# Patient Record
Sex: Female | Born: 1985 | Race: White | Hispanic: No | Marital: Married | Smoking: Never smoker
Health system: Southern US, Community
[De-identification: ages and names within clinical notes are randomized; demographics above are authoritative.]

## PROBLEM LIST (undated history)

## (undated) ENCOUNTER — Inpatient Hospital Stay (HOSPITAL_COMMUNITY): Payer: Self-pay

## (undated) DIAGNOSIS — J309 Allergic rhinitis, unspecified: Secondary | ICD-10-CM

## (undated) DIAGNOSIS — F419 Anxiety disorder, unspecified: Secondary | ICD-10-CM

## (undated) DIAGNOSIS — D369 Benign neoplasm, unspecified site: Secondary | ICD-10-CM

## (undated) DIAGNOSIS — R631 Polydipsia: Secondary | ICD-10-CM

## (undated) DIAGNOSIS — Z6832 Body mass index (BMI) 32.0-32.9, adult: Secondary | ICD-10-CM

## (undated) DIAGNOSIS — E039 Hypothyroidism, unspecified: Secondary | ICD-10-CM

## (undated) DIAGNOSIS — R197 Diarrhea, unspecified: Secondary | ICD-10-CM

## (undated) DIAGNOSIS — N979 Female infertility, unspecified: Secondary | ICD-10-CM

## (undated) DIAGNOSIS — R569 Unspecified convulsions: Secondary | ICD-10-CM

## (undated) DIAGNOSIS — G4710 Hypersomnia, unspecified: Secondary | ICD-10-CM

## (undated) DIAGNOSIS — E669 Obesity, unspecified: Secondary | ICD-10-CM

## (undated) DIAGNOSIS — Z23 Encounter for immunization: Secondary | ICD-10-CM

## (undated) DIAGNOSIS — R1030 Lower abdominal pain, unspecified: Secondary | ICD-10-CM

## (undated) HISTORY — DX: Unspecified convulsions: R56.9

## (undated) HISTORY — DX: Allergic rhinitis, unspecified: J30.9

## (undated) HISTORY — DX: Hypersomnia, unspecified: G47.10

## (undated) HISTORY — DX: Body mass index (BMI) 32.0-32.9, adult: Z68.32

## (undated) HISTORY — PX: WISDOM TOOTH EXTRACTION: SHX21

## (undated) HISTORY — DX: Anxiety disorder, unspecified: F41.9

## (undated) HISTORY — DX: Obesity, unspecified: E66.9

## (undated) HISTORY — DX: Encounter for immunization: Z23

## (undated) HISTORY — DX: Female infertility, unspecified: N97.9

## (undated) HISTORY — DX: Diarrhea, unspecified: R19.7

## (undated) HISTORY — DX: Lower abdominal pain, unspecified: R10.30

## (undated) HISTORY — DX: Polydipsia: R63.1

---
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
## 2013-10-18 ENCOUNTER — Inpatient Hospital Stay (HOSPITAL_COMMUNITY)
Admission: AD | Admit: 2013-10-18 | Discharge: 2013-10-18 | Disposition: A | Discharge status: Home / Self Care | Payer: 59 | Source: Ambulatory Visit | Attending: Obstetrics & Gynecology | Admitting: Obstetrics & Gynecology

## 2013-10-18 ENCOUNTER — Telehealth (HOSPITAL_COMMUNITY): Payer: Self-pay

## 2013-10-18 ENCOUNTER — Encounter (HOSPITAL_COMMUNITY): Payer: Self-pay

## 2013-10-18 DIAGNOSIS — O99891 Other specified diseases and conditions complicating pregnancy: Secondary | ICD-10-CM | POA: Insufficient documentation

## 2013-10-18 DIAGNOSIS — O9989 Other specified diseases and conditions complicating pregnancy, childbirth and the puerperium: Principal | ICD-10-CM

## 2013-10-18 DIAGNOSIS — R6883 Chills (without fever): Secondary | ICD-10-CM | POA: Insufficient documentation

## 2013-10-18 DIAGNOSIS — R059 Cough, unspecified: Secondary | ICD-10-CM | POA: Insufficient documentation

## 2013-10-18 DIAGNOSIS — H9209 Otalgia, unspecified ear: Secondary | ICD-10-CM | POA: Insufficient documentation

## 2013-10-18 DIAGNOSIS — R6889 Other general symptoms and signs: Secondary | ICD-10-CM

## 2013-10-18 DIAGNOSIS — J029 Acute pharyngitis, unspecified: Secondary | ICD-10-CM | POA: Insufficient documentation

## 2013-10-18 DIAGNOSIS — R05 Cough: Secondary | ICD-10-CM | POA: Insufficient documentation

## 2013-10-18 LAB — RAPID STREP SCREEN (MED CTR MEBANE ONLY): Streptococcus, Group A Screen (Direct): NEGATIVE

## 2013-10-18 MED ORDER — OSELTAMIVIR PHOSPHATE 75 MG PO CAPS: 75.0000 mg | ORAL_CAPSULE | Freq: Two times a day (BID) | ORAL | Status: DC

## 2013-10-18 NOTE — MAU Provider Note (Signed)
Chief Complaint: Sore Throat, Otalgia and Cough   First Provider Initiated Contact with Patient 10/18/13 1805     SUBJECTIVE HPI: Tanya Richards is a 28 y.o. G1P0 at [redacted]w[redacted]d by LMP who presents to maternity admissions reporting sudden onset of sore throat, ear pain, cough, body aches, and chills yesterday.  She has no known exposures to illness.  She denies abdominal pain, vaginal bleeding, vaginal itching/burning, urinary symptoms, h/a, dizziness, or n/v.  She took Tylenol a few hours ago for her body aches.     Past Medical History  Diagnosis Date  . Medical history non-contributory    Past Surgical History  Procedure Laterality Date  . No past surgeries     History   Social History  . Marital Status: Married    Spouse Name: N/A    Number of Children: N/A  . Years of Education: N/A   Occupational History  . Not on file.   Social History Main Topics  . Smoking status: Never Smoker   . Smokeless tobacco: Never Used  . Alcohol Use: No  . Drug Use: No  . Sexual Activity: Yes    Birth Control/ Protection: None   Other Topics Concern  . Not on file   Social History Narrative  . No narrative on file   No current facility-administered medications on file prior to encounter.   No current outpatient prescriptions on file prior to encounter.   Allergies  Allergen Reactions  . Augmentin [Amoxicillin-Pot Clavulanate] Other (See Comments)    Patient had "really bad nightmares" while using this  . Benadryl [Diphenhydramine] Other (See Comments)    Patient states she has "really bad nightmares" while taking this    ROS: Pertinent items in HPI  OBJECTIVE Blood pressure 125/64, pulse 79, temperature 99 F (37.2 C), temperature source Oral, resp. rate 16, height 5' 5.5" (1.664 m), weight 160 lb 12.8 oz (72.938 kg), last menstrual period 08/28/2013, SpO2 100.00%. GENERAL: Well-developed, well-nourished female in no acute distress.  HEENT: Normocephalic HEART: normal  rate RESP: normal effort ABDOMEN: Soft, non-tender EXTREMITIES: Nontender, no edema NEURO: Alert and oriented  LAB RESULTS Rapid strep test negative Flu swab pending  ASSESSMENT 1. Flu-like symptoms   2. Sore throat     PLAN Discharge home Rx for Tamiflu BID x5 days Return to MAU or Urgent Care if symptoms persist or worsen F/U with your OB provider as scheduled     Medication List         acetaminophen 500 MG tablet  Commonly known as:  TYLENOL  Take 500 mg by mouth every 6 (six) hours as needed for moderate pain.     metFORMIN 500 MG 24 hr tablet  Commonly known as:  GLUCOPHAGE-XR  Take 500 mg by mouth daily.     oseltamivir 75 MG capsule  Commonly known as:  TAMIFLU  Take 1 capsule (75 mg total) by mouth 2 (two) times daily.     prenatal multivitamin Tabs tablet  Take 1 tablet by mouth daily at 12 noon.       Follow-up Information   Follow up with MORRIS, MEGAN, DO. (or Urgent Care for respiratory symptoms.  Return to MAU as needed.)    Specialty:  Obstetrics and Gynecology   Contact information:   [address], [place], [place] [address] [phone]       Fatima Blank Certified Nurse-Midwife 10/18/2013  8:27 PM

## 2013-10-18 NOTE — Discharge Instructions (Signed)
Influenza, Adult Influenza ("the flu") is a viral infection of the respiratory tract. It occurs more often in winter months because people spend more time in close contact with one another. Influenza can make you feel very sick. Influenza easily spreads from person to person (contagious). CAUSES  Influenza is caused by a virus that infects the respiratory tract. You can catch the virus by breathing in droplets from an infected person's cough or sneeze. You can also catch the virus by touching something that was recently contaminated with the virus and then touching your mouth, nose, or eyes. SYMPTOMS  Symptoms typically last 4 to 10 days and may include:  Fever.  Chills.  Headache, body aches, and muscle aches.  Sore throat.  Chest discomfort and cough.  Poor appetite.  Weakness or feeling tired.  Dizziness.  Nausea or vomiting. DIAGNOSIS  Diagnosis of influenza is often made based on your history and a physical exam. A nose or throat swab test can be done to confirm the diagnosis. RISKS AND COMPLICATIONS You may be at risk for a more severe case of influenza if you smoke cigarettes, have diabetes, have chronic heart disease (such as heart failure) or lung disease (such as asthma), or if you have a weakened immune system. Elderly people and pregnant women are also at risk for more serious infections. The most common complication of influenza is a lung infection (pneumonia). Sometimes, this complication can require emergency medical care and may be life-threatening. PREVENTION  An annual influenza vaccination (flu shot) is the best way to avoid getting influenza. An annual flu shot is now routinely recommended for all adults in the [place] TREATMENT  In mild cases, influenza goes away on its own. Treatment is directed at relieving symptoms. For more severe cases, your caregiver may prescribe antiviral medicines to shorten the sickness. Antibiotic medicines are not effective, because the  infection is caused by a virus, not by bacteria. HOME CARE INSTRUCTIONS  Only take over-the-counter or prescription medicines for pain, discomfort, or fever as directed by your caregiver.  Use a cool mist humidifier to make breathing easier.  Get plenty of rest until your temperature returns to normal. This usually takes 3 to 4 days.  Drink enough fluids to keep your urine clear or pale yellow.  Cover your mouth and nose when coughing or sneezing, and wash your hands well to avoid spreading the virus.  Stay home from work or school until your fever has been gone for at least 1 full day. SEEK MEDICAL CARE IF:   You have chest pain or a deep cough that worsens or produces more mucus.  You have nausea, vomiting, or diarrhea. SEEK IMMEDIATE MEDICAL CARE IF:   You have difficulty breathing, shortness of breath, or your skin or nails turn bluish.  You have severe neck pain or stiffness.  You have a severe headache, facial pain, or earache.  You have a worsening or recurring fever.  You have nausea or vomiting that cannot be controlled. MAKE SURE YOU:  Understand these instructions.  Will watch your condition.  Will get help right away if you are not doing well or get worse. Document Released: 09/29/2000 Document Revised: 04/02/2012 Document Reviewed: 01/01/2012 [facility] Patient Information 2014 [place], [place].  Strep Throat Strep throat is an infection of the throat caused by a bacteria named Streptococcus pyogenes. Your caregiver may call the infection streptococcal "tonsillitis" or "pharyngitis" depending on whether there are signs of inflammation in the tonsils or back of the throat. Strep  throat is most common in children aged 5 15 years during the cold months of the year, but it can occur in people of any age during any season. This infection is spread from person to person (contagious) through coughing, sneezing, or other close contact. SYMPTOMS   Fever or  chills.  Painful, swollen, red tonsils or throat.  Pain or difficulty when swallowing.  White or yellow spots on the tonsils or throat.  Swollen, tender lymph nodes or "glands" of the neck or under the jaw.  Red rash all over the body (rare). DIAGNOSIS  Many different infections can cause the same symptoms. A test must be done to confirm the diagnosis so the right treatment can be given. A "rapid strep test" can help your caregiver make the diagnosis in a few minutes. If this test is not available, a light swab of the infected area can be used for a throat culture test. If a throat culture test is done, results are usually available in a day or two. TREATMENT  Strep throat is treated with antibiotic medicine. HOME CARE INSTRUCTIONS   Gargle with 1 tsp of salt in 1 cup of warm water, 3 4 times per day or as needed for comfort.  Family members who also have a sore throat or fever should be tested for strep throat and treated with antibiotics if they have the strep infection.  Make sure everyone in your household washes their hands well.  Do not share food, drinking cups, or personal items that could cause the infection to spread to others.  You may need to eat a soft food diet until your sore throat gets better.  Drink enough water and fluids to keep your urine clear or pale yellow. This will help prevent dehydration.  Get plenty of rest.  Stay home from school, daycare, or work until you have been on antibiotics for 24 hours.  Only take over-the-counter or prescription medicines for pain, discomfort, or fever as directed by your caregiver.  If antibiotics are prescribed, take them as directed. Finish them even if you start to feel better. SEEK MEDICAL CARE IF:   The glands in your neck continue to enlarge.  You develop a rash, cough, or earache.  You cough up green, yellow-brown, or bloody sputum.  You have pain or discomfort not controlled by medicines.  Your problems  seem to be getting worse rather than better. SEEK IMMEDIATE MEDICAL CARE IF:   You develop any new symptoms such as vomiting, severe headache, stiff or painful neck, chest pain, shortness of breath, or trouble swallowing.  You develop severe throat pain, drooling, or changes in your voice.  You develop swelling of the neck, or the skin on the neck becomes red and tender.  You have a fever.  You develop signs of dehydration, such as fatigue, dry mouth, and decreased urination.  You become increasingly sleepy, or you cannot wake up completely. Document Released: 09/29/2000 Document Revised: 09/18/2012 Document Reviewed: 12/01/2010 [facility] Patient Information 2014 [place], [place].

## 2013-10-18 NOTE — MAU Note (Signed)
Patient states she has had a confirmed pregnancy in the office. Was on Clomid. States she started have some achyness since 12-30, has started a sore throat and a little cough. Has not had a temperature over 99.4

## 2013-10-19 LAB — INFLUENZA PANEL BY PCR (TYPE A & B)
H1N1 flu by pcr: NOT DETECTED
Influenza A By PCR: NEGATIVE
Influenza B By PCR: NEGATIVE

## 2013-10-20 ENCOUNTER — Telehealth: Payer: Self-pay | Admitting: Advanced Practice Midwife

## 2013-10-20 LAB — CULTURE, GROUP A STREP

## 2013-10-20 MED ORDER — AZITHROMYCIN 250 MG PO TABS: ORAL_TABLET | ORAL | Status: DC

## 2013-10-20 NOTE — Telephone Encounter (Signed)
Called pt to tell her about negative flu swab.  Pt reported ongoing body aches, (now symptoms x5 days), productive cough with thick green mucous.  Called Dr Gaetano Net, recommend continue Tamiflu, add Z-pak x5 days.  Drink plenty of PO fluids.  F/U as needed in office or MAU.  Informed pt of recommendation, prescription for Z-pak added, pt to continue Tamiflu.  Pt states understanding.

## 2013-10-30 LAB — OB RESULTS CONSOLE HEPATITIS B SURFACE ANTIGEN: Hepatitis B Surface Ag: NEGATIVE

## 2013-10-30 LAB — OB RESULTS CONSOLE GC/CHLAMYDIA
Chlamydia: NEGATIVE
Gonorrhea: NEGATIVE

## 2013-10-30 LAB — OB RESULTS CONSOLE ABO/RH: RH Type: POSITIVE

## 2013-10-30 LAB — OB RESULTS CONSOLE HIV ANTIBODY (ROUTINE TESTING): HIV: NONREACTIVE

## 2013-10-30 LAB — OB RESULTS CONSOLE RUBELLA ANTIBODY, IGM: Rubella: IMMUNE

## 2013-10-30 LAB — OB RESULTS CONSOLE ANTIBODY SCREEN: Antibody Screen: NEGATIVE

## 2013-10-30 LAB — OB RESULTS CONSOLE RPR: RPR: NONREACTIVE

## 2014-05-04 LAB — OB RESULTS CONSOLE GBS: GBS: NEGATIVE

## 2014-06-02 ENCOUNTER — Telehealth (HOSPITAL_COMMUNITY): Payer: Self-pay | Admitting: *Deleted

## 2014-06-02 ENCOUNTER — Encounter (HOSPITAL_COMMUNITY): Payer: Self-pay | Admitting: *Deleted

## 2014-06-02 NOTE — Telephone Encounter (Signed)
Preadmission screen  

## 2014-06-03 ENCOUNTER — Encounter (HOSPITAL_COMMUNITY): Payer: Self-pay | Admitting: *Deleted

## 2014-06-06 ENCOUNTER — Encounter (HOSPITAL_COMMUNITY): Payer: Self-pay | Admitting: *Deleted

## 2014-06-06 ENCOUNTER — Inpatient Hospital Stay (HOSPITAL_COMMUNITY)
Admission: AD | Admit: 2014-06-06 | Discharge: 2014-06-08 | DRG: 775 | Disposition: A | Discharge status: Home / Self Care | Payer: 59 | Source: Ambulatory Visit | Attending: Obstetrics and Gynecology | Admitting: Obstetrics and Gynecology

## 2014-06-06 DIAGNOSIS — O479 False labor, unspecified: Secondary | ICD-10-CM | POA: Diagnosis present

## 2014-06-06 DIAGNOSIS — Z8249 Family history of ischemic heart disease and other diseases of the circulatory system: Secondary | ICD-10-CM | POA: Diagnosis not present

## 2014-06-06 DIAGNOSIS — Z833 Family history of diabetes mellitus: Secondary | ICD-10-CM

## 2014-06-06 LAB — CBC
HCT: 36.8 % (ref 36.0–46.0)
Hemoglobin: 12.9 g/dL (ref 12.0–15.0)
MCH: 31.6 pg (ref 26.0–34.0)
MCHC: 35.1 g/dL (ref 30.0–36.0)
MCV: 90.2 fL (ref 78.0–100.0)
Platelets: 249 10*3/uL (ref 150–400)
RBC: 4.08 MIL/uL (ref 3.87–5.11)
RDW: 12.3 % (ref 11.5–15.5)
WBC: 11.5 10*3/uL — ABNORMAL HIGH (ref 4.0–10.5)

## 2014-06-06 LAB — RPR

## 2014-06-06 MED ORDER — CITRIC ACID-SODIUM CITRATE 334-500 MG/5ML PO SOLN: 30.0000 mL | ORAL | Status: DC | PRN

## 2014-06-06 MED ORDER — ONDANSETRON HCL 4 MG/2ML IJ SOLN: 4.0000 mg | Freq: Four times a day (QID) | INTRAMUSCULAR | Status: DC | PRN

## 2014-06-06 MED ORDER — IBUPROFEN 600 MG PO TABS: 600.0000 mg | ORAL_TABLET | Freq: Four times a day (QID) | ORAL | Status: DC | PRN

## 2014-06-06 MED ORDER — OXYTOCIN 40 UNITS IN LACTATED RINGERS INFUSION - SIMPLE MED
62.5000 mL/h | INTRAVENOUS | Status: DC
  Administered 2014-06-06: 62.5 mL/h via INTRAVENOUS
  Filled 2014-06-06: qty 1000

## 2014-06-06 MED ORDER — BENZOCAINE-MENTHOL 20-0.5 % EX AERO
1.0000 "application " | INHALATION_SPRAY | CUTANEOUS | Status: DC | PRN
  Administered 2014-06-06: 1 via TOPICAL
  Filled 2014-06-06 (×2): qty 56

## 2014-06-06 MED ORDER — OXYCODONE-ACETAMINOPHEN 5-325 MG PO TABS: 1.0000 | ORAL_TABLET | ORAL | Status: DC | PRN

## 2014-06-06 MED ORDER — OXYCODONE-ACETAMINOPHEN 5-325 MG PO TABS
1.0000 | ORAL_TABLET | ORAL | Status: DC | PRN
Start: 2014-06-06 — End: 2014-06-08

## 2014-06-06 MED ORDER — SENNOSIDES-DOCUSATE SODIUM 8.6-50 MG PO TABS
2.0000 | ORAL_TABLET | ORAL | Status: DC
  Administered 2014-06-07 – 2014-06-08 (×2): 2 via ORAL
  Filled 2014-06-06 (×2): qty 2

## 2014-06-06 MED ORDER — TETANUS-DIPHTH-ACELL PERTUSSIS 5-2.5-18.5 LF-MCG/0.5 IM SUSP
0.5000 mL | Freq: Once | INTRAMUSCULAR | Status: DC
  Filled 2014-06-06: qty 0.5

## 2014-06-06 MED ORDER — SIMETHICONE 80 MG PO CHEW: 80.0000 mg | CHEWABLE_TABLET | ORAL | Status: DC | PRN

## 2014-06-06 MED ORDER — MEASLES, MUMPS & RUBELLA VAC ~~LOC~~ INJ
0.5000 mL | INJECTION | Freq: Once | SUBCUTANEOUS | Status: DC
  Filled 2014-06-06: qty 0.5

## 2014-06-06 MED ORDER — FLEET ENEMA 7-19 GM/118ML RE ENEM
1.0000 | ENEMA | RECTAL | Status: DC | PRN
Start: 2014-06-06 — End: 2014-06-06

## 2014-06-06 MED ORDER — LIDOCAINE HCL (PF) 1 % IJ SOLN
30.0000 mL | INTRAMUSCULAR | Status: DC | PRN
  Administered 2014-06-06: 30 mL via SUBCUTANEOUS
  Filled 2014-06-06: qty 30

## 2014-06-06 MED ORDER — MEDROXYPROGESTERONE ACETATE 150 MG/ML IM SUSP: 150.0000 mg | INTRAMUSCULAR | Status: DC | PRN

## 2014-06-06 MED ORDER — ONDANSETRON HCL 4 MG/2ML IJ SOLN: 4.0000 mg | INTRAMUSCULAR | Status: DC | PRN

## 2014-06-06 MED ORDER — PRENATAL MULTIVITAMIN CH
1.0000 | ORAL_TABLET | Freq: Every day | ORAL | Status: DC
  Administered 2014-06-07 – 2014-06-08 (×2): 1 via ORAL
  Filled 2014-06-06 (×2): qty 1

## 2014-06-06 MED ORDER — ACETAMINOPHEN 325 MG PO TABS: 650.0000 mg | ORAL_TABLET | ORAL | Status: DC | PRN

## 2014-06-06 MED ORDER — ONDANSETRON HCL 4 MG PO TABS: 4.0000 mg | ORAL_TABLET | ORAL | Status: DC | PRN

## 2014-06-06 MED ORDER — LANOLIN HYDROUS EX OINT: TOPICAL_OINTMENT | CUTANEOUS | Status: DC | PRN

## 2014-06-06 MED ORDER — OXYTOCIN BOLUS FROM INFUSION: 500.0000 mL | INTRAVENOUS | Status: DC

## 2014-06-06 MED ORDER — IBUPROFEN 600 MG PO TABS
600.0000 mg | ORAL_TABLET | Freq: Four times a day (QID) | ORAL | Status: DC
  Administered 2014-06-06 – 2014-06-08 (×7): 600 mg via ORAL
  Filled 2014-06-06 (×7): qty 1

## 2014-06-06 MED ORDER — LACTATED RINGERS IV SOLN: 500.0000 mL | INTRAVENOUS | Status: DC | PRN

## 2014-06-06 MED ORDER — DIBUCAINE 1 % RE OINT
1.0000 | TOPICAL_OINTMENT | RECTAL | Status: DC | PRN
Start: 2014-06-06 — End: 2014-06-08
  Filled 2014-06-06: qty 28

## 2014-06-06 MED ORDER — LACTATED RINGERS IV SOLN: INTRAVENOUS | Status: DC

## 2014-06-06 MED ORDER — WITCH HAZEL-GLYCERIN EX PADS: 1.0000 "application " | MEDICATED_PAD | CUTANEOUS | Status: DC | PRN

## 2014-06-06 NOTE — MAU Note (Signed)
Contractions started last night around 2030. Have gotten worse since, also possible ROM.

## 2014-06-06 NOTE — Progress Notes (Signed)
SVD of vigerous female infant w/ apgars of 9,9.  Placenta delivered spontaneous w/ 3VC.   2nd degree lac repaired w/ 3-0 vicryl rapide.  Fundus firm.  EBL 350cc .

## 2014-06-06 NOTE — Plan of Care (Signed)
Problem: Phase I Progression Outcomes Goal: Pain controlled with appropriate interventions Outcome: Progressing Low intervention, all natural pain relief measures.

## 2014-06-06 NOTE — H&P (Signed)
Tanya Richards is a 28 y.o. female G1P0 @ 40+2 wks presenting for labor.  No lof or vb.  Pregnancy uncomplicated.  History OB History   Grav Para Term Preterm Abortions TAB SAB Ect Mult Living   1              Past Medical History  Diagnosis Date  . Medical history non-contributory   . Anxiety   . Seizures     as a child for 1 year  . Immunization, BCG   . Infertility, female    Past Surgical History  Procedure Laterality Date  . No past surgeries    . Wisdom tooth extraction     Family History: family history includes Cancer in her maternal grandmother and paternal grandmother; Depression in her maternal grandmother and mother; Diabetes in her paternal grandfather and paternal grandmother; Hyperlipidemia in her father; Hypertension in her father. Social History:  reports that she has never smoked. She has never used smokeless tobacco. She reports that she does not drink alcohol or use illicit drugs.   Prenatal Transfer Tool  Maternal Diabetes: No Genetic Screening: Normal Maternal Ultrasounds/Referrals: Normal Fetal Ultrasounds or other Referrals:  None Maternal Substance Abuse:  No Significant Maternal Medications:  None Significant Maternal Lab Results:  None Other Comments:  None  ROS  Dilation: 7 Effacement (%): 100 Station: -2 Exam by:: dr Albesa Seen change Blood pressure 123/62, pulse 54, temperature 98.1 F (36.7 C), temperature source Axillary, resp. rate 20, height 5' 5.5" (1.664 m), weight 89.721 kg (197 lb 12.8 oz), last menstrual period 08/28/2013. Exam Physical Exam Gen - uncomfortable w/ ctx Abd - gravid, NT Ext - NT, trace edema bilaterally Cvx - 4cm on admit 7cm now  - AROM, clear  Prenatal labs: ABO, Rh: A/Positive/-- (01/15 0000) Antibody: Negative (01/15 0000) Rubella: Immune (01/15 0000) RPR: Nonreactive (01/15 0000)  HBsAg: Negative (01/15 0000)  HIV: Non-reactive (01/15 0000)  GBS: Negative (07/20 0000)    Assessment/Plan: Labor Ext mngt   Nikko Goldwire 06/06/2014, 5:26 PM

## 2014-06-07 LAB — CBC
HCT: 35.8 % — ABNORMAL LOW (ref 36.0–46.0)
Hemoglobin: 12.1 g/dL (ref 12.0–15.0)
MCH: 31.2 pg (ref 26.0–34.0)
MCHC: 33.8 g/dL (ref 30.0–36.0)
MCV: 92.3 fL (ref 78.0–100.0)
Platelets: 247 10*3/uL (ref 150–400)
RBC: 3.88 MIL/uL (ref 3.87–5.11)
RDW: 12.6 % (ref 11.5–15.5)
WBC: 13.8 10*3/uL — ABNORMAL HIGH (ref 4.0–10.5)

## 2014-06-07 MED ORDER — IBUPROFEN 600 MG PO TABS: 600.0000 mg | ORAL_TABLET | Freq: Four times a day (QID) | ORAL | Status: DC

## 2014-06-07 NOTE — Progress Notes (Addendum)
Post Partum Day 1 Subjective: no complaints  Objective: Blood pressure 113/59, pulse 65, temperature 98.3 F (36.8 C), temperature source Oral, resp. rate 20, height 5' 5.5" (1.664 m), weight 89.721 kg (197 lb 12.8 oz), last menstrual period 08/28/2013, SpO2 96.00%, unknown if currently breastfeeding.  Physical Exam:  General: alert and cooperative Lochia: appropriate Uterine Fundus: firm Incision: n/a DVT Evaluation: No evidence of DVT seen on physical exam.   Recent Labs  06/06/14 1320 06/07/14 0556  HGB 12.9 12.1  HCT 36.8 35.8*    Assessment/Plan: Pt requesting early discharge.    LOS: 1 day   Taylan Mayhan 06/07/2014, 7:04 AM

## 2014-06-07 NOTE — Lactation Note (Signed)
This note was copied from the chart of Tanya Byron Tipping. Lactation Consultation Note  Patient Name: Tanya Richards [id number] Date: 06/07/2014 Reason for consult: Initial assessment Mom reports some nipple tenderness and she needs help with latch. Baby spitty today, but awake and giving feeding ques. Assisted Mom with positioning and obtaining good depth with latch. Demonstrated how to bring bottom lip down. Mom reports less discomfort. Basic teaching reviewed. Cluster feeding discussed. Care for sore nipples reviewed. Right nipple is cracked. Advised to apply EBM, comfort gels given with instructions. Lactation brochure left for review, advised of OP services and support group. Encouraged Mom to call for assist as needed.   Maternal Data Formula Feeding for Exclusion: No Has patient been taught Hand Expression?: Yes Does the patient have breastfeeding experience prior to this delivery?: No  Feeding Feeding Type: Breast Fed Length of feed: 10 min  LATCH Score/Interventions Latch: Grasps breast easily, tongue down, lips flanged, rhythmical sucking. Intervention(s): Adjust position;Assist with latch;Breast massage;Breast compression  Audible Swallowing: A few with stimulation  Type of Nipple: Everted at rest and after stimulation  Comfort (Breast/Nipple): Filling, red/small blisters or bruises, mild/mod discomfort  Problem noted: Cracked, bleeding, blisters, bruises;Mild/Moderate discomfort Interventions  (Cracked/bleeding/bruising/blister): Expressed breast milk to nipple Interventions (Mild/moderate discomfort): Comfort gels  Hold (Positioning): Assistance needed to correctly position infant at breast and maintain latch. Intervention(s): Breastfeeding basics reviewed;Support Pillows;Position options;Skin to skin  LATCH Score: 7  Lactation Tools Discussed/Used Tools: Comfort gels   Consult Status Consult Status: Follow-up Date: 06/08/14 Follow-up type:  In-patient    Katrine Coho 06/07/2014, 7:40 PM

## 2014-06-07 NOTE — Discharge Summary (Signed)
Obstetric Discharge Summary Reason for Admission: onset of labor Prenatal Procedures: ultrasound Intrapartum Procedures: spontaneous vaginal delivery Postpartum Procedures: none Complications-Operative and Postpartum: 2nd degree perineal laceration Hemoglobin  Date Value Ref Range Status  06/07/2014 12.1  12.0 - 15.0 g/dL Final     HCT  Date Value Ref Range Status  06/07/2014 35.8* 36.0 - 46.0 % Final    Physical Exam:  General: alert and cooperative Lochia: appropriate Uterine Fundus: firm Incision: healing well, no significant drainage DVT Evaluation: No evidence of DVT seen on physical exam.  Discharge Diagnoses: Term Pregnancy-delivered  Discharge Information: Date: 06/07/2014 Activity: pelvic rest Diet: routine Medications: PNV and Ibuprofen Condition: stable Instructions: refer to practice specific booklet Discharge to: home Follow-up Information   Follow up with Margarette Asal, MD. Schedule an appointment as soon as possible for a visit in 6 weeks.   Specialty:  Obstetrics and Gynecology   Contact information:   [address] [place] [address] [phone]       Newborn Data: Live born female  Birth Weight: 7 lb 5.1 oz (3320 g) APGAR: 9, 9  Home with mother.  Tanya Richards 06/07/2014, 7:44 AM

## 2014-06-08 NOTE — Progress Notes (Signed)
Post Partum Day 2 Subjective: no complaints, up ad lib, voiding and tolerating PO  Objective: Blood pressure 110/60, pulse 62, temperature 98.5 F (36.9 C), temperature source Oral, resp. rate 19, height 5' 5.5" (1.664 m), weight 197 lb 12.8 oz (89.721 kg), last menstrual period 08/28/2013, SpO2 96.00%, unknown if currently breastfeeding.  Physical Exam:  General: alert and cooperative Lochia: appropriate Uterine Fundus: firm Incision: perineum intact DVT Evaluation: No evidence of DVT seen on physical exam. Negative Homan's sign. No cords or calf tenderness. No significant calf/ankle edema.   Recent Labs  06/06/14 1320 06/07/14 0556  HGB 12.9 12.1  HCT 36.8 35.8*    Assessment/Plan: Discharge home   LOS: 2 days   Samayah Novinger G 06/08/2014, 8:35 AM

## 2014-06-09 ENCOUNTER — Inpatient Hospital Stay (HOSPITAL_COMMUNITY): Admission: RE | Admit: 2014-06-09 | Payer: 59 | Source: Ambulatory Visit

## 2014-08-17 ENCOUNTER — Encounter (HOSPITAL_COMMUNITY): Payer: Self-pay | Admitting: *Deleted

## 2015-04-21 ENCOUNTER — Emergency Department (HOSPITAL_COMMUNITY)
Admission: EM | Admit: 2015-04-21 | Discharge: 2015-04-21 | Disposition: A | Discharge status: Home / Self Care | Payer: 59 | Attending: Emergency Medicine | Admitting: Emergency Medicine

## 2015-04-21 ENCOUNTER — Encounter (HOSPITAL_COMMUNITY): Payer: Self-pay | Admitting: *Deleted

## 2015-04-21 ENCOUNTER — Emergency Department (HOSPITAL_COMMUNITY): Payer: 59

## 2015-04-21 DIAGNOSIS — R1033 Periumbilical pain: Secondary | ICD-10-CM | POA: Insufficient documentation

## 2015-04-21 DIAGNOSIS — R1031 Right lower quadrant pain: Secondary | ICD-10-CM | POA: Insufficient documentation

## 2015-04-21 DIAGNOSIS — Z8742 Personal history of other diseases of the female genital tract: Secondary | ICD-10-CM | POA: Diagnosis not present

## 2015-04-21 DIAGNOSIS — Z3202 Encounter for pregnancy test, result negative: Secondary | ICD-10-CM | POA: Diagnosis not present

## 2015-04-21 DIAGNOSIS — R109 Unspecified abdominal pain: Secondary | ICD-10-CM

## 2015-04-21 DIAGNOSIS — Z8659 Personal history of other mental and behavioral disorders: Secondary | ICD-10-CM | POA: Insufficient documentation

## 2015-04-21 LAB — CBC WITH DIFFERENTIAL/PLATELET
Basophils Absolute: 0 10*3/uL (ref 0.0–0.1)
Basophils Relative: 0 % (ref 0–1)
Eosinophils Absolute: 0.2 10*3/uL (ref 0.0–0.7)
Eosinophils Relative: 2 % (ref 0–5)
HCT: 41.7 % (ref 36.0–46.0)
Hemoglobin: 13.5 g/dL (ref 12.0–15.0)
Lymphocytes Relative: 25 % (ref 12–46)
Lymphs Abs: 2.1 10*3/uL (ref 0.7–4.0)
MCH: 30.1 pg (ref 26.0–34.0)
MCHC: 32.4 g/dL (ref 30.0–36.0)
MCV: 92.9 fL (ref 78.0–100.0)
Monocytes Absolute: 0.5 10*3/uL (ref 0.1–1.0)
Monocytes Relative: 7 % (ref 3–12)
Neutro Abs: 5.5 10*3/uL (ref 1.7–7.7)
Neutrophils Relative %: 66 % (ref 43–77)
Platelets: 268 10*3/uL (ref 150–400)
RBC: 4.49 MIL/uL (ref 3.87–5.11)
RDW: 12.7 % (ref 11.5–15.5)
WBC: 8.3 10*3/uL (ref 4.0–10.5)

## 2015-04-21 LAB — URINALYSIS, ROUTINE W REFLEX MICROSCOPIC
Bilirubin Urine: NEGATIVE
Glucose, UA: NEGATIVE mg/dL
Ketones, ur: NEGATIVE mg/dL
Leukocytes, UA: NEGATIVE
Nitrite: NEGATIVE
Protein, ur: NEGATIVE mg/dL
Specific Gravity, Urine: 1.01 (ref 1.005–1.030)
Urobilinogen, UA: 0.2 mg/dL (ref 0.0–1.0)
pH: 7 (ref 5.0–8.0)

## 2015-04-21 LAB — COMPREHENSIVE METABOLIC PANEL
ALT: 16 U/L (ref 14–54)
AST: 19 U/L (ref 15–41)
Albumin: 4.1 g/dL (ref 3.5–5.0)
Alkaline Phosphatase: 68 U/L (ref 38–126)
Anion gap: 8 (ref 5–15)
BUN: 22 mg/dL — ABNORMAL HIGH (ref 6–20)
CO2: 26 mmol/L (ref 22–32)
Calcium: 9.4 mg/dL (ref 8.9–10.3)
Chloride: 104 mmol/L (ref 101–111)
Creatinine, Ser: 0.63 mg/dL (ref 0.44–1.00)
GFR calc Af Amer: >60 mL/min (ref >60)
GFR calc non Af Amer: >60 mL/min (ref >60)
Glucose, Bld: 103 mg/dL — ABNORMAL HIGH (ref 65–99)
Potassium: 4 mmol/L (ref 3.5–5.1)
Sodium: 138 mmol/L (ref 135–145)
Total Bilirubin: 1.2 mg/dL (ref 0.3–1.2)
Total Protein: 7.4 g/dL (ref 6.5–8.1)

## 2015-04-21 LAB — WET PREP, GENITAL
Clue Cells Wet Prep HPF POC: NONE SEEN
Trich, Wet Prep: NONE SEEN
Yeast Wet Prep HPF POC: NONE SEEN

## 2015-04-21 LAB — LIPASE, BLOOD: Lipase: 20 U/L — ABNORMAL LOW (ref 22–51)

## 2015-04-21 LAB — POC URINE PREG, ED: Preg Test, Ur: NEGATIVE

## 2015-04-21 LAB — URINE MICROSCOPIC-ADD ON

## 2015-04-21 MED ORDER — IOHEXOL 300 MG/ML  SOLN
100.0000 mL | Freq: Once | INTRAMUSCULAR | Status: AC | PRN
  Administered 2015-04-21: 100 mL via INTRAVENOUS

## 2015-04-21 MED ORDER — ONDANSETRON 8 MG PO TBDP
8.0000 mg | ORAL_TABLET | Freq: Once | ORAL | Status: AC
  Administered 2015-04-21: 8 mg via ORAL
  Filled 2015-04-21: qty 1

## 2015-04-21 MED ORDER — IOHEXOL 300 MG/ML  SOLN
50.0000 mL | Freq: Once | INTRAMUSCULAR | Status: AC | PRN
  Administered 2015-04-21: 50 mL via ORAL

## 2015-04-21 NOTE — ED Provider Notes (Signed)
CSN: [id number]     Arrival date & time 04/21/15  1513 History   First MD Initiated Contact with Patient 04/21/15 1613     Chief Complaint  Patient presents with  . Abdominal Pain     (Consider location/radiation/quality/duration/timing/severity/associated sxs/prior Treatment) HPI    PCP: No primary care provider on file. Blood pressure 128/80, pulse 78, temperature 98 F (36.7 C), temperature source Oral, resp. rate 18, SpO2 99 %, unknown if currently breastfeeding.  Tanya Richards is a 29 y.o.female with a significant PMH of anxiety presents to the ER with complaints of abdominal pains. She had a baby 10 months ago and developed abdominal pain three days ago that is periumbilical and RLQ. She called her OB who felt that she needed to go to the ER for evaluation. She has had recent nasal drainage, nausea, vomited 3 x on Friday. She has also had a sore throat recently. She has not had a menstruation since becoming pregnant and questions if this pain is due to starting her menstruation. She is having associated bloating, increased bowel movements, food makes the pain worse..  .  The patient denies diaphoresis, fever, headache, weakness (general or focal), confusion, change of vision,  neck pain, dysphagia, aphagia, chest pain, shortness of breath,  back pain, lower extremity swelling, rash.   Past Medical History  Diagnosis Date  . Medical history non-contributory   . Anxiety   . Seizures     as a child for 1 year  . Immunization, BCG   . Infertility, female    Past Surgical History  Procedure Laterality Date  . No past surgeries    . Wisdom tooth extraction     Family History  Problem Relation Age of Onset  . Hypertension Father   . Hyperlipidemia Father   . Diabetes Paternal Grandmother   . Cancer Paternal Grandmother     pancreatic  . Diabetes Paternal Grandfather   . Depression Mother   . Depression Maternal Grandmother   . Cancer Maternal Grandmother     colon    History  Substance Use Topics  . Smoking status: Never Smoker   . Smokeless tobacco: Never Used  . Alcohol Use: No   OB History    Gravida Para Term Preterm AB TAB SAB Ectopic Multiple Living   1 1 1       1      Review of Systems  10 Systems reviewed and are negative for acute change except as noted in the HPI.    Allergies  Augmentin and Benadryl  Home Medications   Prior to Admission medications   Medication Sig Start Date End Date Taking? Authorizing Provider  cetirizine (ZYRTEC) 10 MG tablet Take 10 mg by mouth daily as needed for allergies.   Yes Historical Provider, MD  fluticasone (FLONASE) 50 MCG/ACT nasal spray Place 1 spray into both nostrils daily as needed for allergies or rhinitis.   Yes Historical Provider, MD  Prenatal Vit-Fe Fumarate-FA (PRENATAL MULTIVITAMIN) TABS tablet Take 1 tablet by mouth daily.    Yes Historical Provider, MD   BP 109/43 mmHg  Pulse 49  Temp(Src) 98 F (36.7 C) (Oral)  Resp 16  SpO2 98% Physical Exam  Constitutional: She is oriented to person, place, and time. She appears well-developed and well-nourished. No distress.  HENT:  Head: Normocephalic and atraumatic.  Eyes: Pupils are equal, round, and reactive to light.  Neck: Normal range of motion. Neck supple.  Cardiovascular: Normal rate and regular rhythm.  Pulmonary/Chest: Effort normal.  Abdominal: Soft. Bowel sounds are normal. She exhibits no distension. There is tenderness in the right lower quadrant and periumbilical area. There is guarding. There is no rigidity and no rebound.  Genitourinary: Uterus normal. There is no rash or tenderness on the right labia. There is no rash or tenderness on the left labia. Cervix exhibits no motion tenderness and no friability. Right adnexum displays no mass and no tenderness. Left adnexum displays no mass and no tenderness. No bleeding in the vagina. No foreign body around the vagina. No signs of injury around the vagina. No vaginal  discharge found.  Neurological: She is alert and oriented to person, place, and time.  Skin: Skin is warm and dry.  Nursing note and vitals reviewed.   ED Course  Procedures (including critical care time) Labs Review Labs Reviewed  WET PREP, GENITAL - Abnormal; Notable for the following:    WBC, Wet Prep HPF POC FEW (*)    All other components within normal limits  URINALYSIS, ROUTINE W REFLEX MICROSCOPIC (NOT AT [facility]) - Abnormal; Notable for the following:    Hgb urine dipstick TRACE (*)    All other components within normal limits  LIPASE, BLOOD - Abnormal; Notable for the following:    Lipase 20 (*)    All other components within normal limits  COMPREHENSIVE METABOLIC PANEL - Abnormal; Notable for the following:    Glucose, Bld 103 (*)    BUN 22 (*)    All other components within normal limits  CBC WITH DIFFERENTIAL/PLATELET  URINE MICROSCOPIC-ADD ON  POC URINE PREG, ED  GC/CHLAMYDIA PROBE AMP ([place]) NOT AT [facility]    Imaging Review US Transvaginal Non-ob  04/21/2015   CLINICAL DATA:  Acute onset of right lower quadrant abdominal pain. Initial encounter.  EXAM: TRANSABDOMINAL AND TRANSVAGINAL ULTRASOUND OF PELVIS  DOPPLER ULTRASOUND OF OVARIES  TECHNIQUE: Both transabdominal and transvaginal ultrasound examinations of the pelvis were performed. Transabdominal technique was performed for global imaging of the pelvis including uterus, ovaries, adnexal regions, and pelvic cul-de-sac.  It was necessary to proceed with endovaginal exam following the transabdominal exam to visualize the ovaries in greater detail. Color and duplex Doppler ultrasound was utilized to evaluate blood flow to the ovaries.  COMPARISON:  CT of the abdomen and pelvis performed earlier today at 6:02 p.m.  FINDINGS: Uterus  Measurements: 6.5 x 2.9 x 3.8 cm. No fibroids or other mass visualized.  Endometrium  Thickness: 0.3 cm.  No focal abnormality visualized.  Right ovary  Measurements: 2.9 x 2.2 x 1.8 cm.  Normal appearance/no adnexal mass.  Left ovary  Measurements: 2.7 x 2.5 x 2.1 cm. Normal appearance/no adnexal mass.  Pulsed Doppler evaluation of both ovaries demonstrates normal low-resistance arterial and venous waveforms.  Other findings  No free fluid is seen within the pelvic cul-de-sac.  IMPRESSION: Unremarkable pelvic ultrasound.  No evidence for ovarian torsion.   Electronically Signed   By: Garald Balding M.D.   On: 04/21/2015 20:05   US Pelvis Complete  04/21/2015   CLINICAL DATA:  Acute onset of right lower quadrant abdominal pain. Initial encounter.  EXAM: TRANSABDOMINAL AND TRANSVAGINAL ULTRASOUND OF PELVIS  DOPPLER ULTRASOUND OF OVARIES  TECHNIQUE: Both transabdominal and transvaginal ultrasound examinations of the pelvis were performed. Transabdominal technique was performed for global imaging of the pelvis including uterus, ovaries, adnexal regions, and pelvic cul-de-sac.  It was necessary to proceed with endovaginal exam following the transabdominal exam to visualize the ovaries in greater  detail. Color and duplex Doppler ultrasound was utilized to evaluate blood flow to the ovaries.  COMPARISON:  CT of the abdomen and pelvis performed earlier today at 6:02 p.m.  FINDINGS: Uterus  Measurements: 6.5 x 2.9 x 3.8 cm. No fibroids or other mass visualized.  Endometrium  Thickness: 0.3 cm.  No focal abnormality visualized.  Right ovary  Measurements: 2.9 x 2.2 x 1.8 cm. Normal appearance/no adnexal mass.  Left ovary  Measurements: 2.7 x 2.5 x 2.1 cm. Normal appearance/no adnexal mass.  Pulsed Doppler evaluation of both ovaries demonstrates normal low-resistance arterial and venous waveforms.  Other findings  No free fluid is seen within the pelvic cul-de-sac.  IMPRESSION: Unremarkable pelvic ultrasound.  No evidence for ovarian torsion.   Electronically Signed   By: Garald Balding M.D.   On: 04/21/2015 20:05   Ct Abdomen Pelvis W Contrast  04/21/2015   CLINICAL DATA:  Right-sided abdominal pain.  Right lower quadrant pain.  EXAM: CT ABDOMEN AND PELVIS WITH CONTRAST  TECHNIQUE: Multidetector CT imaging of the abdomen and pelvis was performed using the standard protocol following bolus administration of intravenous contrast.  CONTRAST:  54mL OMNIPAQUE IOHEXOL 300 MG/ML SOLN, 141mL OMNIPAQUE IOHEXOL 300 MG/ML SOLN  COMPARISON:  None.  FINDINGS: Lower chest:  Lung bases are clear.  Hepatobiliary: Liver and gallbladder appear normal.  Pancreas: Normal  Spleen: Normal  Adrenals/Urinary Tract: Adrenal glands and kidneys appear normal.  Stomach/Bowel: The appendix is normal. Image 53. No bowel wall thickening or focal segmental dilatation is identified. Stomach appears normal.  Vascular/Lymphatic: No aortic aneurysm.  No lymphadenopathy.  Other: Uterus and ovaries are normal. No free air or fluid. Small fat containing umbilical hernia.  Musculoskeletal: Osseous structures appear unremarkable.  IMPRESSION: No acute intra-abdominal or pelvic pathology. Specifically, the appendix appears normal.   Electronically Signed   By: Conchita Paris M.D.   On: 04/21/2015 18:08   [place] Art/ven Flow Abd Pelv Doppler  04/21/2015   CLINICAL DATA:  Acute onset of right lower quadrant abdominal pain. Initial encounter.  EXAM: TRANSABDOMINAL AND TRANSVAGINAL ULTRASOUND OF PELVIS  DOPPLER ULTRASOUND OF OVARIES  TECHNIQUE: Both transabdominal and transvaginal ultrasound examinations of the pelvis were performed. Transabdominal technique was performed for global imaging of the pelvis including uterus, ovaries, adnexal regions, and pelvic cul-de-sac.  It was necessary to proceed with endovaginal exam following the transabdominal exam to visualize the ovaries in greater detail. Color and duplex Doppler ultrasound was utilized to evaluate blood flow to the ovaries.  COMPARISON:  CT of the abdomen and pelvis performed earlier today at 6:02 p.m.  FINDINGS: Uterus  Measurements: 6.5 x 2.9 x 3.8 cm. No fibroids or other mass visualized.   Endometrium  Thickness: 0.3 cm.  No focal abnormality visualized.  Right ovary  Measurements: 2.9 x 2.2 x 1.8 cm. Normal appearance/no adnexal mass.  Left ovary  Measurements: 2.7 x 2.5 x 2.1 cm. Normal appearance/no adnexal mass.  Pulsed Doppler evaluation of both ovaries demonstrates normal low-resistance arterial and venous waveforms.  Other findings  No free fluid is seen within the pelvic cul-de-sac.  IMPRESSION: Unremarkable pelvic ultrasound.  No evidence for ovarian torsion.   Electronically Signed   By: Garald Balding M.D.   On: 04/21/2015 20:05     EKG Interpretation None      MDM   Final diagnoses:  RLQ abdominal pain  Abdominal pain, unspecified abdominal location    Received a thorough workup today while in the emergency department. She declines pain medication  throughout her stay she is breast-feeding and did not want to waste any milk. Her urinalysis shows no infection. Her wet prep is also normal. The CMP, Lipase and CBC show no acute findings. Neg urine preg.  The CT scan of the patient's stomach does not show any abnormalities, no appendicitis, colitis or any findings to explain the patient's pain. A trans-vaginal ultrasound was done which was also normal, did not show any cysts, ovarian torsion, abnormal fluid or bleeding.  The etiology of the patient's pain is unclear however emergent etiologies have been ruled out at this time. Patient does not look toxic and continues to decline pain medication throughout her stay. She'll follow-up with her primary care doctor within the next 1-2 days for follow-up. No N/V/D, no weakness, confusion. Pt agreeable to plan for discharge-- aware that early pathology is possible and f/u is very important.  Medications  ondansetron (ZOFRAN-ODT) disintegrating tablet 8 mg (8 mg Oral Given 04/21/15 1553)  iohexol (OMNIPAQUE) 300 MG/ML solution 50 mL (50 mLs Oral Contrast Given 04/21/15 1615)  iohexol (OMNIPAQUE) 300 MG/ML solution 100 mL (100 mLs  Intravenous Contrast Given 04/21/15 1751)    29 y.o.Tanya Richards's evaluation in the Emergency Department is complete. It has been determined that no acute conditions requiring further emergency intervention are present at this time. The patient/guardian have been advised of the diagnosis and plan. We have discussed signs and symptoms that warrant return to the ED, such as changes or worsening in symptoms.  Vital signs are stable at discharge. Filed Vitals:   04/21/15 2102  BP: 109/43  Pulse: 49  Temp:   Resp: 16    Patient/guardian has voiced understanding and agreed to follow-up with the PCP or specialist.     Delos Haring, PA-C 04/22/15 0013  Pamella Pert, MD 04/22/15 1515

## 2015-04-21 NOTE — Discharge Instructions (Signed)
Abdominal Pain, Women °Abdominal (stomach, pelvic, or belly) pain can be caused by many things. It is important to tell your doctor: °· The location of the pain. °· Does it come and go or is it present all the time? °· Are there things that start the pain (eating certain foods, exercise)? °· Are there other symptoms associated with the pain (fever, nausea, vomiting, diarrhea)? °All of this is helpful to know when trying to find the cause of the pain. °CAUSES  °· Stomach: virus or bacteria infection, or ulcer. °· Intestine: appendicitis (inflamed appendix), regional ileitis (Crohn's disease), ulcerative colitis (inflamed colon), irritable bowel syndrome, diverticulitis (inflamed diverticulum of the colon), or cancer of the stomach or intestine. °· Gallbladder disease or stones in the gallbladder. °· Kidney disease, kidney stones, or infection. °· Pancreas infection or cancer. °· Fibromyalgia (pain disorder). °· Diseases of the female organs: °¨ Uterus: fibroid (non-cancerous) tumors or infection. °¨ Fallopian tubes: infection or tubal pregnancy. °¨ Ovary: cysts or tumors. °¨ Pelvic adhesions (scar tissue). °¨ Endometriosis (uterus lining tissue growing in the pelvis and on the pelvic organs). °¨ Pelvic congestion syndrome (female organs filling up with blood just before the menstrual period). °¨ Pain with the menstrual period. °¨ Pain with ovulation (producing an egg). °¨ Pain with an IUD (intrauterine device, birth control) in the uterus. °¨ Cancer of the female organs. °· Functional pain (pain not caused by a disease, may improve without treatment). °· Psychological pain. °· Depression. °DIAGNOSIS  °Your doctor will decide the seriousness of your pain by doing an examination. °· Blood tests. °· X-rays. °· Ultrasound. °· CT scan (computed tomography, special type of X-ray). °· MRI (magnetic resonance imaging). °· Cultures, for infection. °· Barium enema (dye inserted in the large intestine, to better view it with  X-rays). °· Colonoscopy (looking in intestine with a lighted tube). °· Laparoscopy (minor surgery, looking in abdomen with a lighted tube). °· Major abdominal exploratory surgery (looking in abdomen with a large incision). °TREATMENT  °The treatment will depend on the cause of the pain.  °· Many cases can be observed and treated at home. °· Over-the-counter medicines recommended by your caregiver. °· Prescription medicine. °· Antibiotics, for infection. °· Birth control pills, for painful periods or for ovulation pain. °· Hormone treatment, for endometriosis. °· Nerve blocking injections. °· Physical therapy. °· Antidepressants. °· Counseling with a psychologist or psychiatrist. °· Minor or major surgery. °HOME CARE INSTRUCTIONS  °· Do not take laxatives, unless directed by your caregiver. °· Take over-the-counter pain medicine only if ordered by your caregiver. Do not take aspirin because it can cause an upset stomach or bleeding. °· Try a clear liquid diet (broth or water) as ordered by your caregiver. Slowly move to a bland diet, as tolerated, if the pain is related to the stomach or intestine. °· Have a thermometer and take your temperature several times a day, and record it. °· Bed rest and sleep, if it helps the pain. °· Avoid sexual intercourse, if it causes pain. °· Avoid stressful situations. °· Keep your follow-up appointments and tests, as your caregiver orders. °· If the pain does not go away with medicine or surgery, you may try: °¨ Acupuncture. °¨ Relaxation exercises (yoga, meditation). °¨ Group therapy. °¨ Counseling. °SEEK MEDICAL CARE IF:  °· You notice certain foods cause stomach pain. °· Your home care treatment is not helping your pain. °· You need stronger pain medicine. °· You want your IUD removed. °· You feel faint or   lightheaded. °· You develop nausea and vomiting. °· You develop a rash. °· You are having side effects or an allergy to your medicine. °SEEK IMMEDIATE MEDICAL CARE IF:  °· Your  pain does not go away or gets worse. °· You have a fever. °· Your pain is felt only in portions of the abdomen. The right side could possibly be appendicitis. The left lower portion of the abdomen could be colitis or diverticulitis. °· You are passing blood in your stools (bright red or black tarry stools, with or without vomiting). °· You have blood in your urine. °· You develop chills, with or without a fever. °· You pass out. °MAKE SURE YOU:  °· Understand these instructions. °· Will watch your condition. °· Will get help right away if you are not doing well or get worse. °Document Released: 07/30/2007 Document Revised: 02/16/2014 Document Reviewed: 08/19/2009 °[employer]® Patient Information ©2015 [employer]. This information is not intended to replace advice given to you by your health care provider. Make sure you discuss any questions you have with your health care provider. ° °

## 2015-04-21 NOTE — ED Notes (Signed)
RN Ivar Drape starting IV

## 2015-04-21 NOTE — ED Notes (Signed)
PA notified pt has returned from ultrasound at this time

## 2015-04-21 NOTE — ED Notes (Addendum)
Pt is currently breastfeeding. Pt reports lots of nasal drainage, pt has been taking zyrtec and flonase, pt thought she was having nausea from nasal drainage. Pt started having intermittent abd pain and nausea since Friday, vomited 3x on Friday. Saturday had sore throat, went to urgent care, was negative for strep throat, negative pregnancy. Monday started having intermittent sharp abd pain, today started having abd bloating, R sided abd pain, increased bowel movements, food increases abdominal pain. At times abd pain felt like early beginnings of labor. Pt thought it was possibly her first period after having baby, called ob gyn nurse and was encouraged to come to ED. At present 5/10.

## 2015-04-22 LAB — GC/CHLAMYDIA PROBE AMP (~~LOC~~) NOT AT ARMC
Chlamydia: NEGATIVE
Neisseria Gonorrhea: NEGATIVE

## 2015-07-20 ENCOUNTER — Other Ambulatory Visit: Payer: Self-pay | Admitting: Obstetrics and Gynecology

## 2015-07-21 LAB — CYTOLOGY - PAP

## 2015-12-11 IMAGING — US US PELVIS COMPLETE
1 series · 13 of 25 positions shown · non-contrast
Comparison: CT of the abdomen and pelvis performed earlier today at
[DATE] p.m.

CLINICAL DATA: Acute onset of right lower quadrant abdominal pain.
Initial encounter.

EXAM:
TRANSABDOMINAL AND TRANSVAGINAL ULTRASOUND OF PELVIS
DOPPLER ULTRASOUND OF OVARIES
TECHNIQUE: Both transabdominal and transvaginal ultrasound examinations of the
pelvis were performed. Transabdominal technique was performed for
global imaging of the pelvis including uterus, ovaries, adnexal
regions, and pelvic cul-de-sac.
It was necessary to proceed with endovaginal exam following the
transabdominal exam to visualize the ovaries in greater detail.
Color and duplex Doppler ultrasound was utilized to evaluate blood
flow to the ovaries.

[Series 1: us pelvis complete · 0.18mm/px · 13 of 57 slices shown]
[im 1/57]
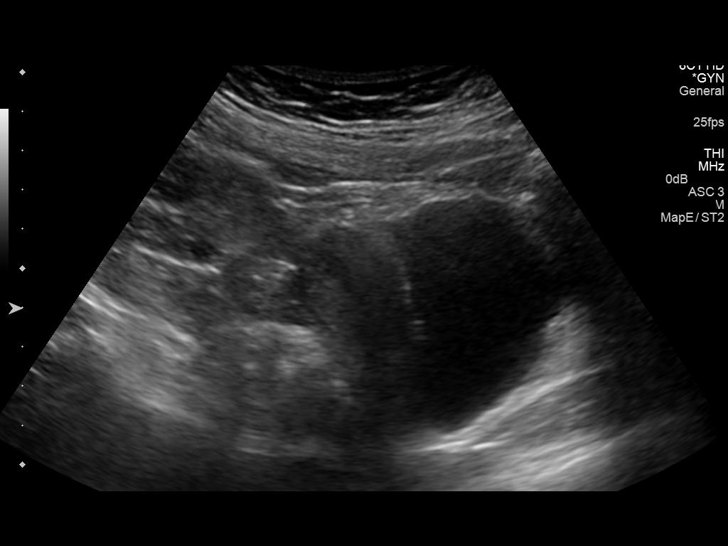
[im 5/57]
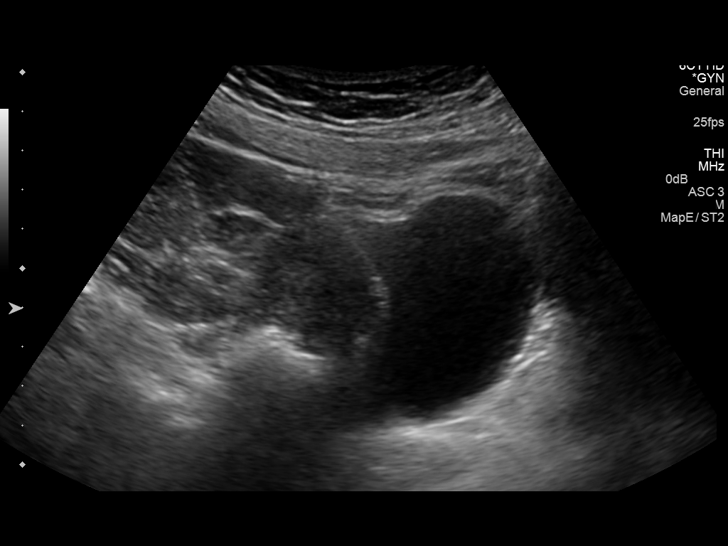
[im 10/57]
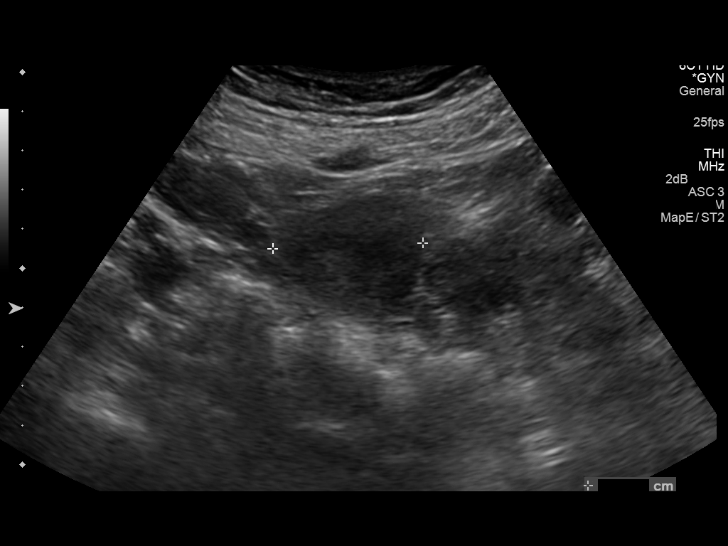
[im 15/57]
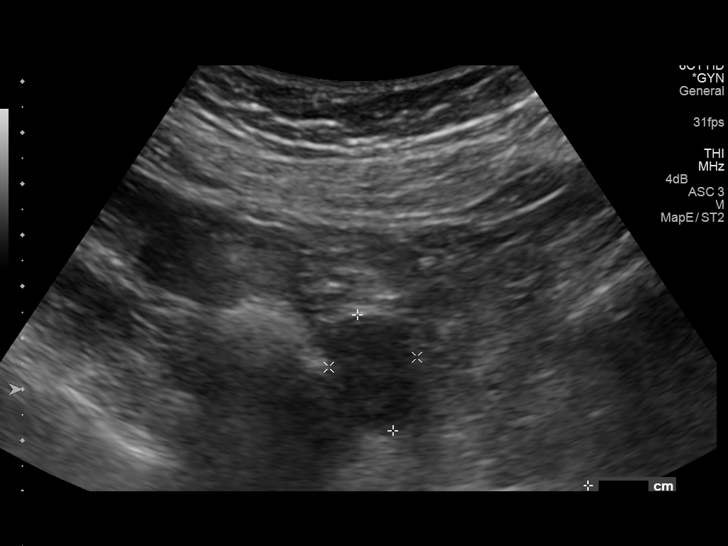
[im 19/57]
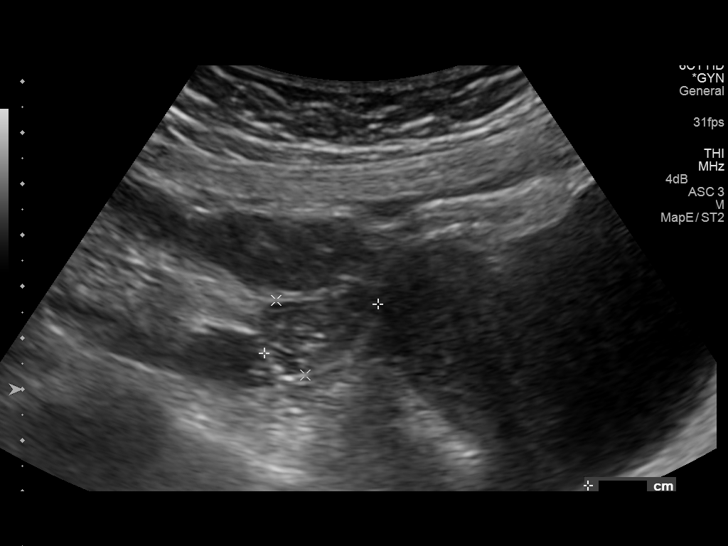
[im 24/57]
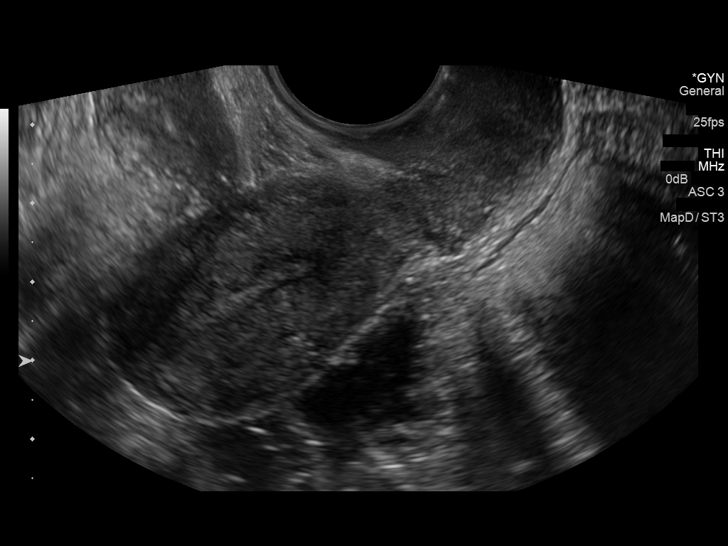
[im 29/57]
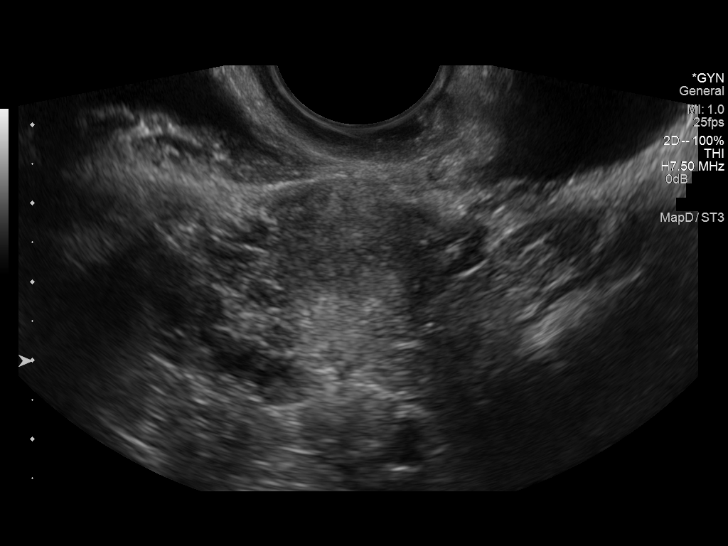
[im 33/57]
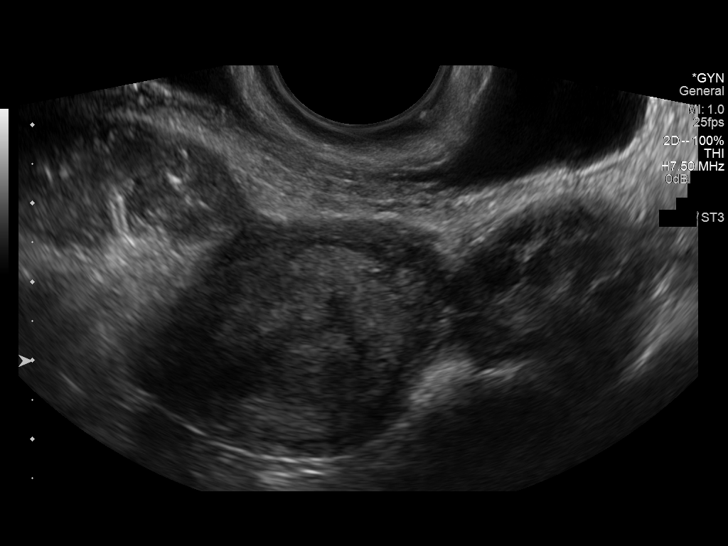
[im 38/57]
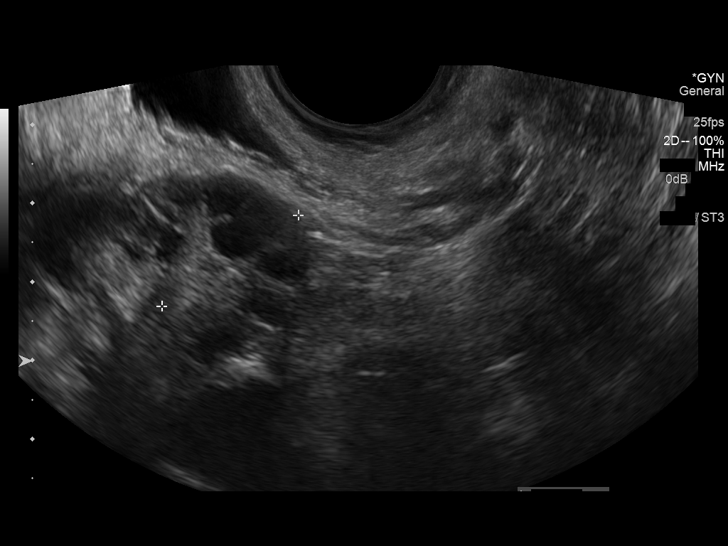
[im 43/57]
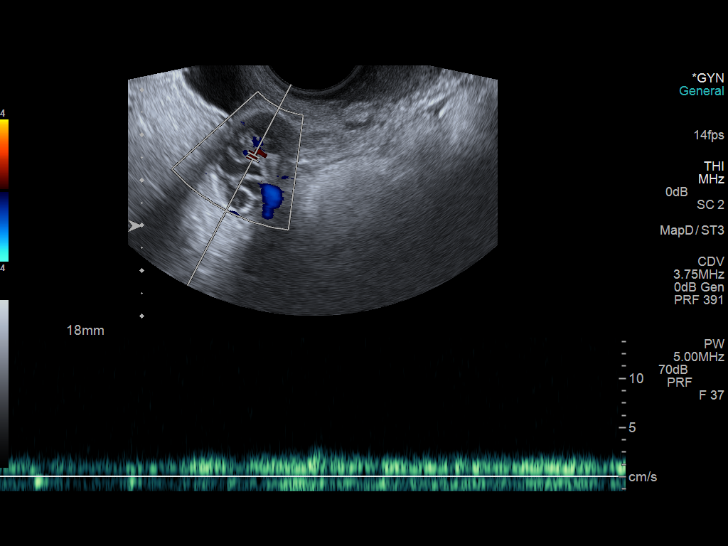
[im 47/57]
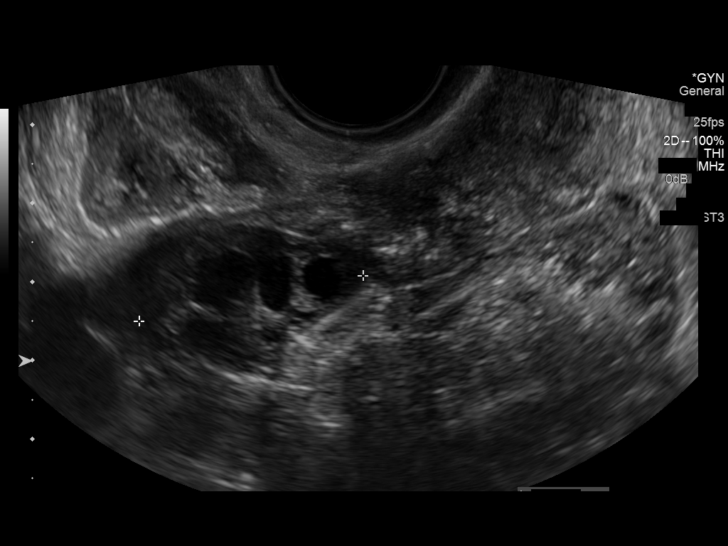
[im 52/57]
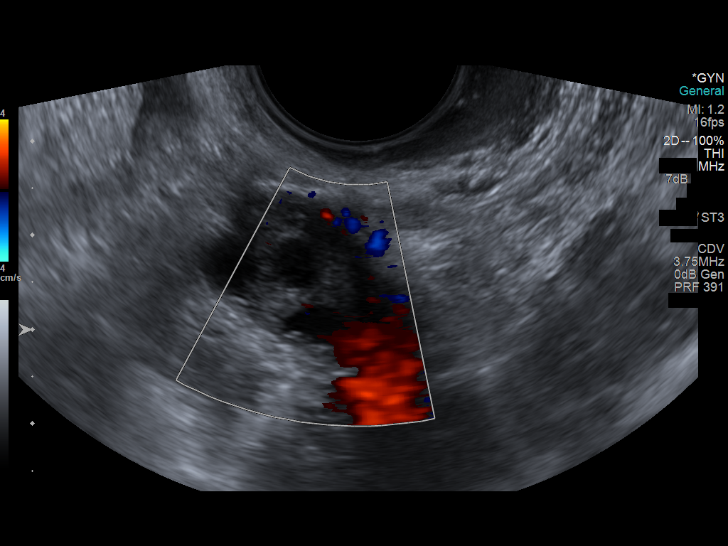
[im 57/57]
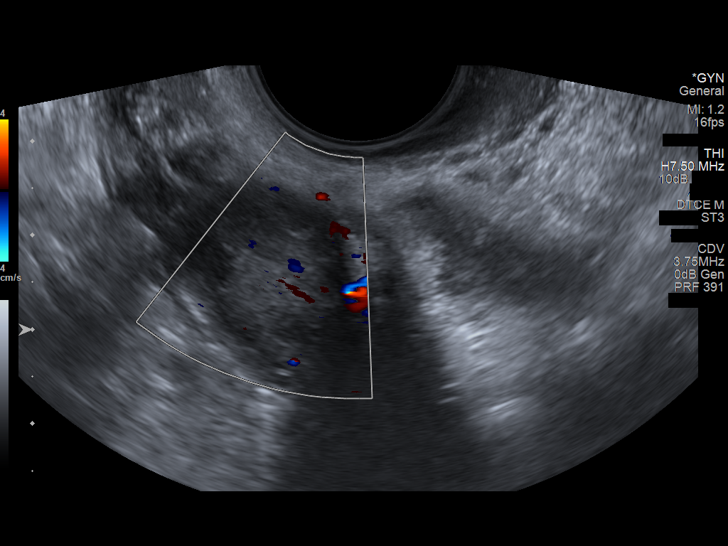

[13 of 25 positions shown; findings below may reference images not displayed]

FINDINGS: Uterus

Measurements: 6.5 x 2.9 x 3.8 cm. No fibroids or other mass
visualized.

Endometrium

Thickness: 0.3 cm.  No focal abnormality visualized.

Right ovary

Measurements: 2.9 x 2.2 x 1.8 cm. Normal appearance/no adnexal mass.

Left ovary

Measurements: 2.7 x 2.5 x 2.1 cm. Normal appearance/no adnexal mass.

Pulsed Doppler evaluation of both ovaries demonstrates normal
low-resistance arterial and venous waveforms.

Other findings

No free fluid is seen within the pelvic cul-de-sac.
IMPRESSION: Unremarkable pelvic ultrasound.  No evidence for ovarian torsion.

## 2016-12-29 DIAGNOSIS — J069 Acute upper respiratory infection, unspecified: Secondary | ICD-10-CM | POA: Diagnosis not present

## 2017-01-16 DIAGNOSIS — J208 Acute bronchitis due to other specified organisms: Secondary | ICD-10-CM | POA: Diagnosis not present

## 2017-01-16 DIAGNOSIS — R062 Wheezing: Secondary | ICD-10-CM | POA: Diagnosis not present

## 2017-01-19 DIAGNOSIS — E039 Hypothyroidism, unspecified: Secondary | ICD-10-CM | POA: Diagnosis not present

## 2017-01-19 DIAGNOSIS — J209 Acute bronchitis, unspecified: Secondary | ICD-10-CM | POA: Diagnosis not present

## 2017-03-13 DIAGNOSIS — Z0000 Encounter for general adult medical examination without abnormal findings: Secondary | ICD-10-CM | POA: Diagnosis not present

## 2017-05-25 IMAGING — CT CT ABD-PELV W/ CM
2 of 4 series · 16 of 46 positions shown, 18 images · IV contrast (OMNIPAQUE 300)
Comparison: None.

CLINICAL DATA: Right-sided abdominal pain. Right lower quadrant
pain.

EXAM:
CT ABDOMEN AND PELVIS WITH CONTRAST
TECHNIQUE: Multidetector CT imaging of the abdomen and pelvis was performed
using the standard protocol following bolus administration of
intravenous contrast.
CONTRAST:  50mL OMNIPAQUE IOHEXOL 300 MG/ML SOLN, 100mL OMNIPAQUE
IOHEXOL 300 MG/ML SOLN

[Series 2: abd/pel with · axial · 0.67mm/px · z∈[+789,+1224]mm · 13 of 95 slices shown, 15 images]
[im 4/95  soft-tissue]
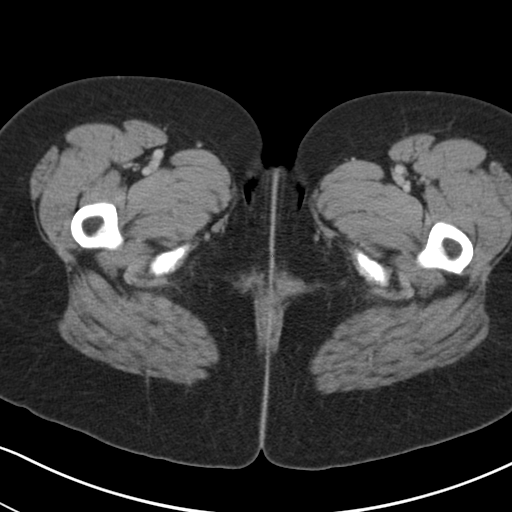
[im 4/95  bone]
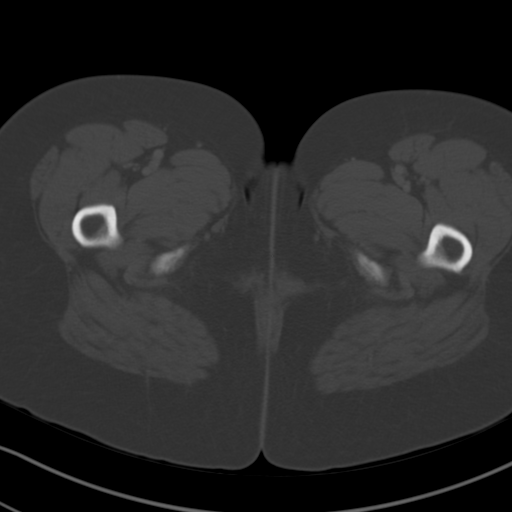
[im 12/95  soft-tissue]
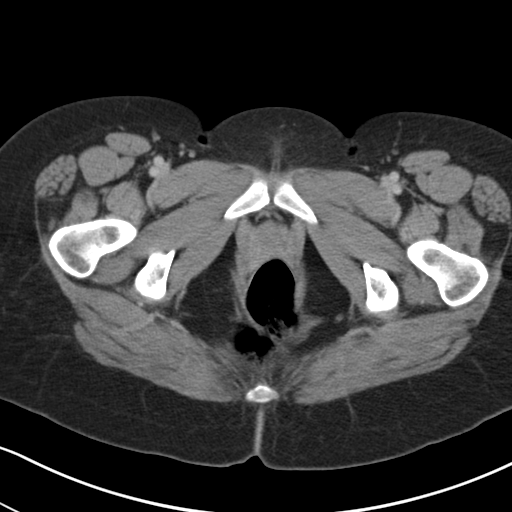
[im 19/95  soft-tissue]
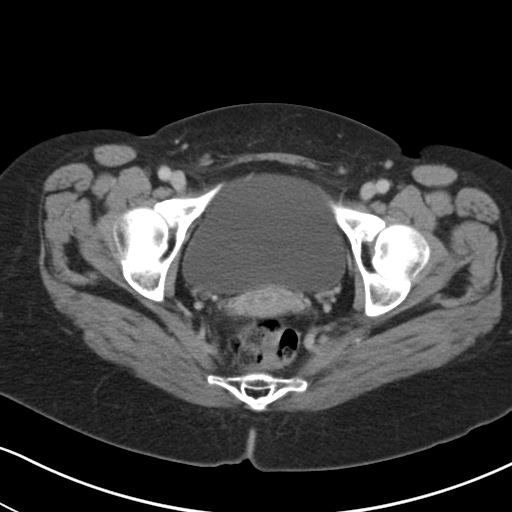
[im 27/95  soft-tissue]
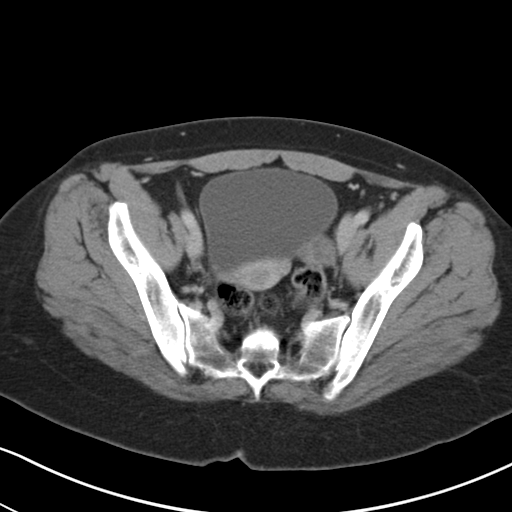
[im 34/95  soft-tissue]
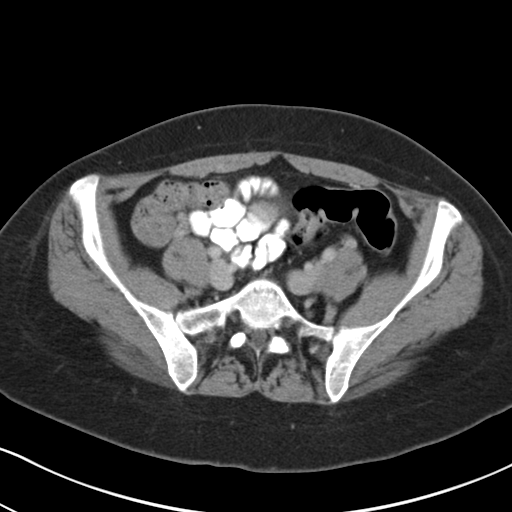
[im 42/95  soft-tissue]
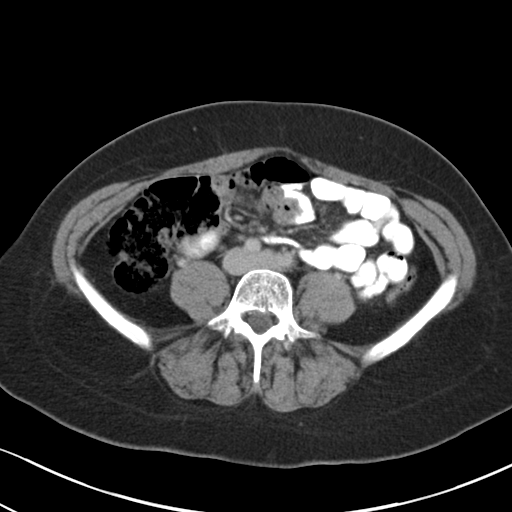
[im 49/95  soft-tissue]
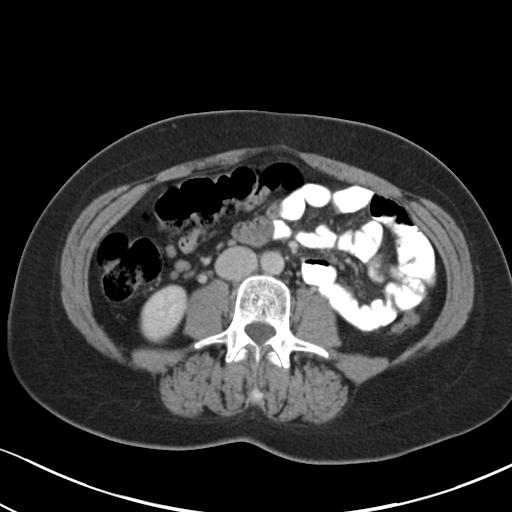
[im 53/95  soft-tissue]
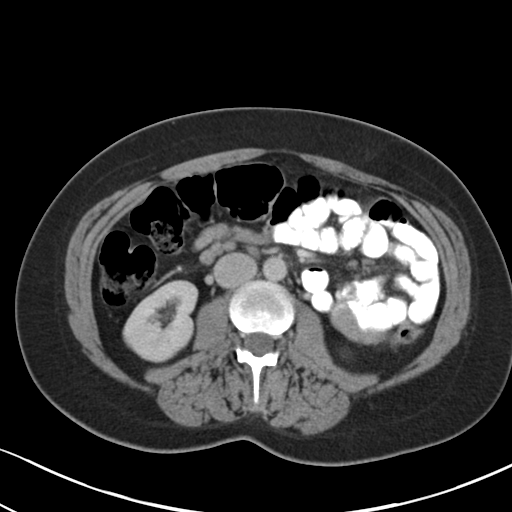
[im 61/95  soft-tissue]
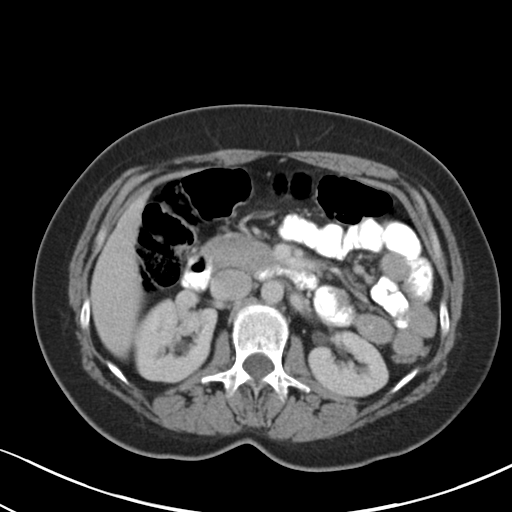
[im 61/95  bone]
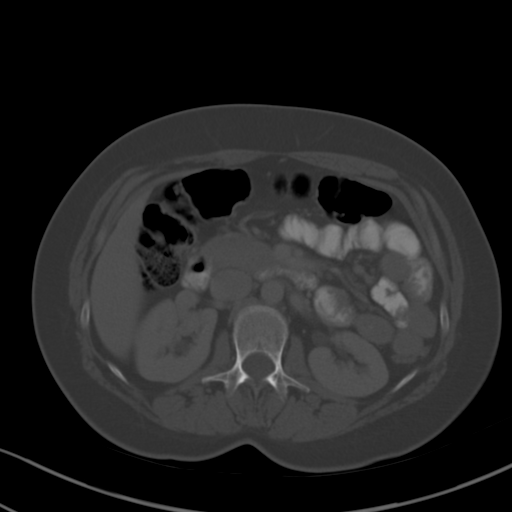
[im 68/95  soft-tissue]
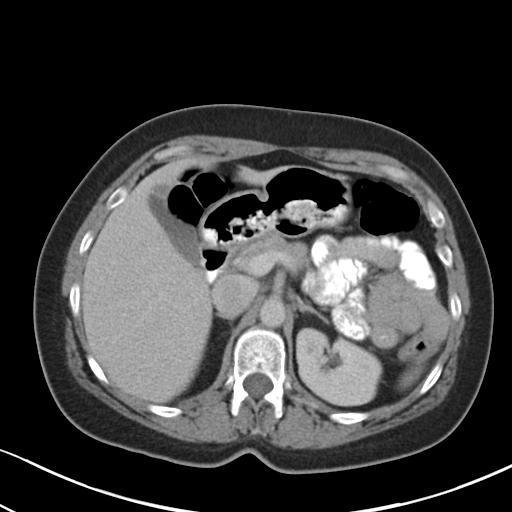
[im 76/95  soft-tissue]
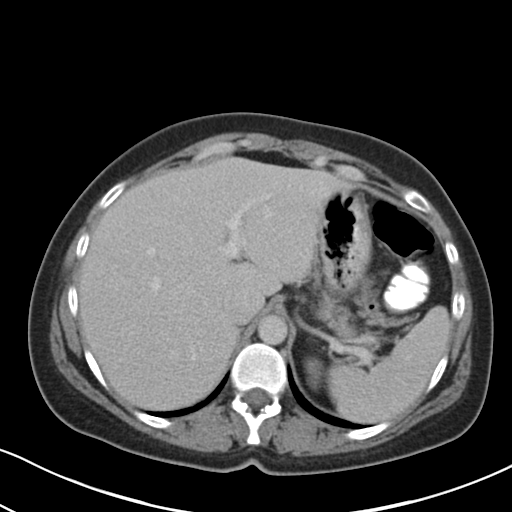
[im 83/95  soft-tissue]
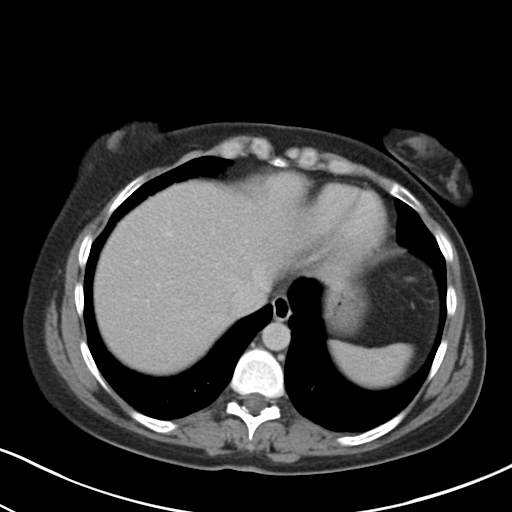
[im 91/95  soft-tissue]
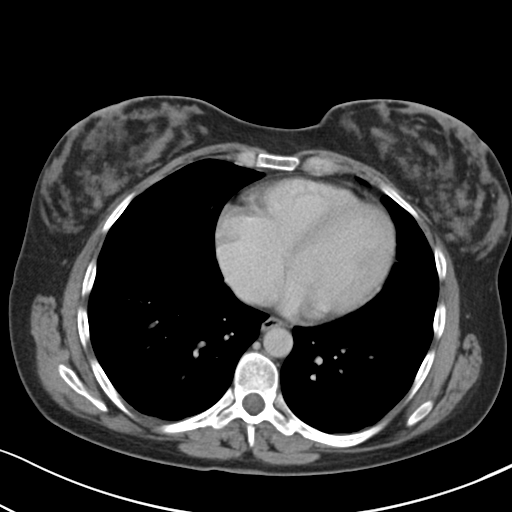

[Series 5: coronal a/|p · coronal · 0.70mm/px · 3 of 82 slices shown]
[im 28/82  soft-tissue]
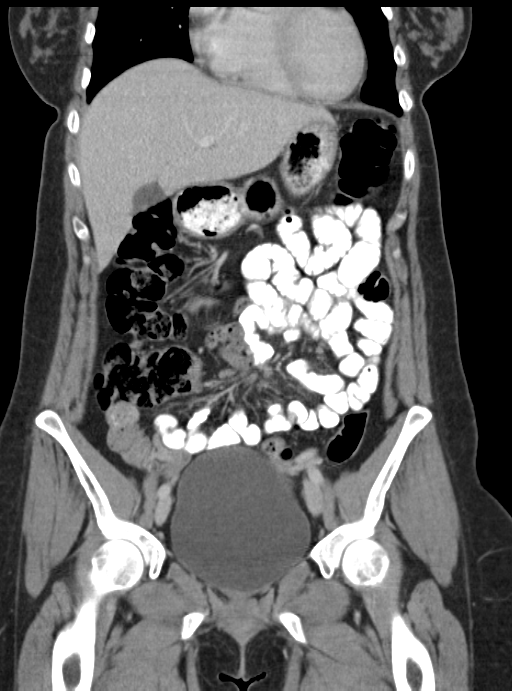
[im 37/82  soft-tissue]
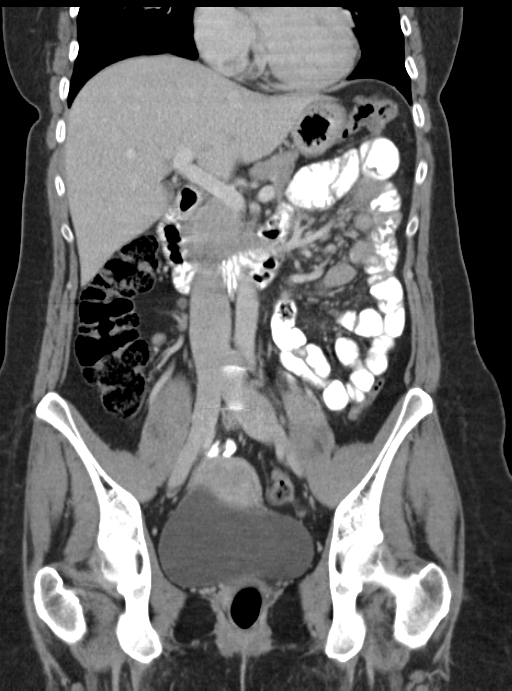
[im 46/82  soft-tissue]
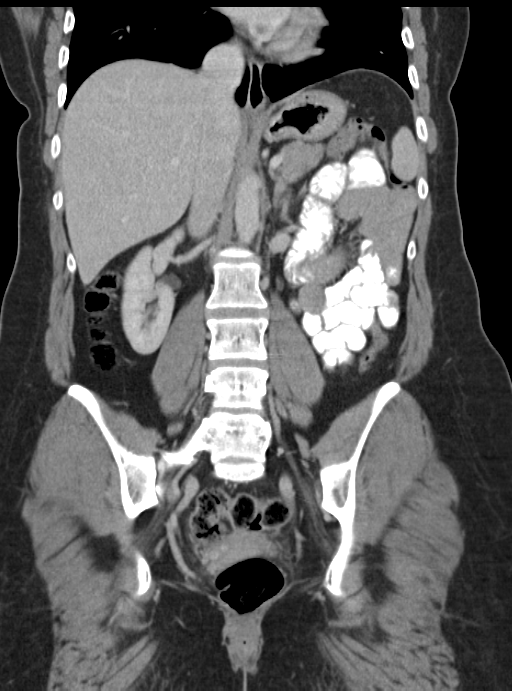

[16 of 46 positions shown; findings below may reference images not displayed]

FINDINGS: Lower chest:  Lung bases are clear.

Hepatobiliary: Liver and gallbladder appear normal.

Pancreas: Normal

Spleen: Normal

Adrenals/Urinary Tract: Adrenal glands and kidneys appear normal.

Stomach/Bowel: The appendix is normal. Image 53. No bowel wall
thickening or focal segmental dilatation is identified. Stomach
appears normal.

Vascular/Lymphatic: No aortic aneurysm.  No lymphadenopathy.

Other: Uterus and ovaries are normal. No free air or fluid. Small
fat containing umbilical hernia.

Musculoskeletal: Osseous structures appear unremarkable.
IMPRESSION: No acute intra-abdominal or pelvic pathology. Specifically, the
appendix appears normal.

## 2017-06-27 DIAGNOSIS — N911 Secondary amenorrhea: Secondary | ICD-10-CM | POA: Diagnosis not present

## 2017-07-03 LAB — OB RESULTS CONSOLE RPR: RPR: NONREACTIVE

## 2017-07-03 LAB — OB RESULTS CONSOLE HIV ANTIBODY (ROUTINE TESTING): HIV: NONREACTIVE

## 2017-07-03 LAB — OB RESULTS CONSOLE GC/CHLAMYDIA
Chlamydia: NEGATIVE
Gonorrhea: NEGATIVE

## 2017-07-03 LAB — OB RESULTS CONSOLE ABO/RH: RH Type: POSITIVE

## 2017-07-03 LAB — OB RESULTS CONSOLE RUBELLA ANTIBODY, IGM: Rubella: IMMUNE

## 2017-07-03 LAB — OB RESULTS CONSOLE ANTIBODY SCREEN: Antibody Screen: NEGATIVE

## 2017-07-03 LAB — OB RESULTS CONSOLE HEPATITIS B SURFACE ANTIGEN: Hepatitis B Surface Ag: NEGATIVE

## 2017-07-25 DIAGNOSIS — Z23 Encounter for immunization: Secondary | ICD-10-CM | POA: Diagnosis not present

## 2017-08-23 DIAGNOSIS — N390 Urinary tract infection, site not specified: Secondary | ICD-10-CM | POA: Diagnosis not present

## 2017-11-02 ENCOUNTER — Ambulatory Visit: Payer: Self-pay | Admitting: Nurse Practitioner

## 2017-11-02 ENCOUNTER — Encounter: Payer: Self-pay | Admitting: Emergency Medicine

## 2017-11-02 VITALS — BP 102/64 | HR 96 | Temp 98.4°F | Resp 20 | Wt 197.6 lb

## 2017-11-02 DIAGNOSIS — J209 Acute bronchitis, unspecified: Secondary | ICD-10-CM

## 2017-11-02 DIAGNOSIS — J019 Acute sinusitis, unspecified: Secondary | ICD-10-CM

## 2017-11-02 MED ORDER — FLUTICASONE PROPIONATE 50 MCG/ACT NA SUSP: 2.0000 | Freq: Every day | NASAL | 0 refills | Status: DC

## 2017-11-02 MED ORDER — AZITHROMYCIN 250 MG PO TABS: ORAL_TABLET | ORAL | 0 refills | Status: AC

## 2017-11-02 NOTE — Patient Instructions (Addendum)
Sinusitis, Adult Sinusitis is soreness and inflammation of your sinuses. Sinuses are hollow spaces in the bones around your face. Your sinuses are located:  Around your eyes.  In the middle of your forehead.  Behind your nose.  In your cheekbones.  Your sinuses and nasal passages are lined with a stringy fluid (mucus). Mucus normally drains out of your sinuses. When your nasal tissues become inflamed or swollen, the mucus can become trapped or blocked so air cannot flow through your sinuses. This allows bacteria, viruses, and funguses to grow, which leads to infection. Sinusitis can develop quickly and last for 7?10 days (acute) or for more than 12 weeks (chronic). Sinusitis often develops after a cold. What are the causes? This condition is caused by anything that creates swelling in the sinuses or stops mucus from draining, including:  Allergies.  Asthma.  Bacterial or viral infection.  Abnormally shaped bones between the nasal passages.  Nasal growths that contain mucus (nasal polyps).  Narrow sinus openings.  Pollutants, such as chemicals or irritants in the air.  A foreign object stuck in the nose.  A fungal infection. This is rare.  What increases the risk? The following factors may make you more likely to develop this condition:  Having allergies or asthma.  Having had a recent cold or respiratory tract infection.  Having structural deformities or blockages in your nose or sinuses.  Having a weak immune system.  Doing a lot of swimming or diving.  Overusing nasal sprays.  Smoking.  What are the signs or symptoms? The main symptoms of this condition are pain and a feeling of pressure around the affected sinuses. Other symptoms include:  Upper toothache.  Earache.  Headache.  Bad breath.  Decreased sense of smell and taste.  A cough that may get worse at night.  Fatigue.  Fever.  Thick drainage from your nose. The drainage is often green and  it may contain pus (purulent).  Stuffy nose or congestion.  Postnasal drip. This is when extra mucus collects in the throat or back of the nose.  Swelling and warmth over the affected sinuses.  Sore throat.  Sensitivity to light.  How is this diagnosed? This condition is diagnosed based on symptoms, a medical history, and a physical exam. To find out if your condition is acute or chronic, your health care provider may:  Look in your nose for signs of nasal polyps.  Tap over the affected sinus to check for signs of infection.  View the inside of your sinuses using an imaging device that has a light attached (endoscope).  If your health care provider suspects that you have chronic sinusitis, you may also:  Be tested for allergies.  Have a sample of mucus taken from your nose (nasal culture) and checked for bacteria.  Have a mucus sample examined to see if your sinusitis is related to an allergy.  If your sinusitis does not respond to treatment and it lasts longer than 8 weeks, you may have an MRI or CT scan to check your sinuses. These scans also help to determine how severe your infection is. In rare cases, a bone biopsy may be done to rule out more serious types of fungal sinus disease. How is this treated? Treatment for sinusitis depends on the cause and whether your condition is chronic or acute. If a virus is causing your sinusitis, your symptoms will go away on their own within 10 days. You may be given medicines to relieve your symptoms,   including:  Topical nasal decongestants. They shrink swollen nasal passages and let mucus drain from your sinuses.  Antihistamines. These drugs block inflammation that is triggered by allergies. This can help to ease swelling in your nose and sinuses.  Topical nasal corticosteroids. These are nasal sprays that ease inflammation and swelling in your nose and sinuses.  Nasal saline washes. These rinses can help to get rid of thick mucus in  your nose.  If your condition is caused by bacteria, you will be given an antibiotic medicine. If your condition is caused by a fungus, you will be given an antifungal medicine. Surgery may be needed to correct underlying conditions, such as narrow nasal passages. Surgery may also be needed to remove polyps. Follow these instructions at home: Medicines  Take, use, or apply over-the-counter and prescription medicines only as told by your health care provider. These may include nasal sprays.  If you were prescribed an antibiotic medicine, take it as told by your health care provider. Do not stop taking the antibiotic even if you start to feel better.  Fluticasone Nasal spray 2 sprays in each nostril daily for 5 days. Hydrate and Humidify  Drink enough water to keep your urine clear or pale yellow. Staying hydrated will help to thin your mucus.  Use a cool mist humidifier to keep the humidity level in your home above 50%.  Inhale steam for 10-15 minutes, 3-4 times a day or as told by your health care provider. You can do this in the bathroom while a hot shower is running.  Limit your exposure to cool or dry air. Rest  Rest as much as possible.  Sleep with your head raised (elevated).  Make sure to get enough sleep each night. General instructions  Apply a warm, moist washcloth to your face 3-4 times a day or as told by your health care provider. This will help with discomfort.  Wash your hands often with soap and water to reduce your exposure to viruses and other germs. If soap and water are not available, use hand sanitizer.  Do not smoke. Avoid being around people who are smoking (secondhand smoke).  Keep all follow-up visits as told by your health care provider. This is important.  Take Delsym (over-the-counter) for cough as directed for 5 days.  Contact a health care provider if:  You have a fever.  Your symptoms get worse.  Your symptoms do not improve within 10  days. Get help right away if:  You have a severe headache.  You have persistent vomiting.  You have pain or swelling around your face or eyes.  You have vision problems.  You develop confusion.  Your neck is stiff.  You have trouble breathing. This information is not intended to replace advice given to you by your health care provider. Make sure you discuss any questions you have with your health care provider. Document Released: 10/02/2005 Document Revised: 05/28/2016 Document Reviewed: 07/28/2015 Elsevier Interactive Patient Education  2018 [employer].  Acute Bronchitis, Adult Acute bronchitis is sudden (acute) swelling of the air tubes (bronchi) in the lungs. Acute bronchitis causes these tubes to fill with mucus, which can make it hard to breathe. It can also cause coughing or wheezing. In adults, acute bronchitis usually goes away within 2 weeks. A cough caused by bronchitis may last up to 3 weeks. Smoking, allergies, and asthma can make the condition worse. Repeated episodes of bronchitis may cause further lung problems, such as chronic obstructive pulmonary disease (COPD).  What are the causes? This condition can be caused by germs and by substances that irritate the lungs, including:  Cold and flu viruses. This condition is most often caused by the same virus that causes a cold.  Bacteria.  Exposure to tobacco smoke, dust, fumes, and air pollution.  What increases the risk? This condition is more likely to develop in people who:  Have close contact with someone with acute bronchitis.  Are exposed to lung irritants, such as tobacco smoke, dust, fumes, and vapors.  Have a weak immune system.  Have a respiratory condition such as asthma.  What are the signs or symptoms? Symptoms of this condition include:  A cough.  Coughing up clear, yellow, or green mucus.  Wheezing.  Chest congestion.  Shortness of breath.  A fever.  Body aches.  Chills.  A sore  throat.  How is this diagnosed? This condition is usually diagnosed with a physical exam. During the exam, your health care provider may order tests, such as chest X-rays, to rule out other conditions. He or she may also:  Test a sample of your mucus for bacterial infection.  Check the level of oxygen in your blood. This is done to check for pneumonia.  Do a chest X-ray or lung function testing to rule out pneumonia and other conditions.  Perform blood tests.  Your health care provider will also ask about your symptoms and medical history. How is this treated? Most cases of acute bronchitis clear up over time without treatment. Your health care provider may recommend:  Drinking more fluids. Drinking more makes your mucus thinner, which may make it easier to breathe.  Taking a medicine for a fever or cough.  Taking an antibiotic medicine.  Using an inhaler to help improve shortness of breath and to control a cough.  Using a cool mist vaporizer or humidifier to make it easier to breathe.  Follow these instructions at home: Medicines  Take over-the-counter and prescription medicines only as told by your health care provider.  If you were prescribed an antibiotic, take it as told by your health care provider. Do not stop taking the antibiotic even if you start to feel better. General instructions  Get plenty of rest.  Drink enough fluids to keep your urine clear or pale yellow.  Avoid smoking and secondhand smoke. Exposure to cigarette smoke or irritating chemicals will make bronchitis worse. If you smoke and you need help quitting, ask your health care provider. Quitting smoking will help your lungs heal faster.  Use an inhaler, cool mist vaporizer, or humidifier as told by your health care provider.  Keep all follow-up visits as told by your health care provider. This is important. How is this prevented? To lower your risk of getting this condition again:  Wash your hands  often with soap and water. If soap and water are not available, use hand sanitizer.  Avoid contact with people who have cold symptoms.  Try not to touch your hands to your mouth, nose, or eyes.  Make sure to get the flu shot every year.  Contact a health care provider if:  Your symptoms do not improve in 2 weeks of treatment. Get help right away if:  You cough up blood.  You have chest pain.  You have severe shortness of breath.  You become dehydrated.  You faint or keep feeling like you are going to faint.  You keep vomiting.  You have a severe headache.  Your fever or chills  gets worse. This information is not intended to replace advice given to you by your health care provider. Make sure you discuss any questions you have with your health care provider. Document Released: 11/09/2004 Document Revised: 04/26/2016 Document Reviewed: 03/22/2016 Elsevier Interactive Patient Education  [employer].

## 2017-11-02 NOTE — Progress Notes (Signed)
Subjective:  Tanya Richards is a 32 y.o. female who presents for evaluation of URI like symptoms.  Symptoms include cough described as worsening over time, nasal congestion, productive cough with green colored sputum, sneezing and sore throat.  Onset of symptoms was 2-3 weeks ago, and has been gradually worsening since that time.  Treatment to date:  decongestants and nasal washing.  High risk factors for influenza complications:  patient is [redacted] weeks pregnant, daughter diagnosed with PNA today..  The following portions of the patient's history were reviewed and updated as appropriate:  allergies, current medications and past medical history.  Constitutional: positive for fatigue, negative for chills, fevers and sweats Eyes: negative Ears, nose, mouth, throat, and face: positive for earaches, hoarseness, nasal congestion and sore throat, negative for ear drainage Respiratory: positive for cough and wheezing Cardiovascular: negative Neurological: positive for headaches Behavioral/Psych: negative Allergic/Immunologic: positive for hay fever Objective:  BP 102/64 (BP Location: Right Arm, Patient Position: Sitting, Cuff Size: Normal)   Pulse 96   Temp 98.4 F (36.9 C) (Oral)   Resp 20   Wt 197 lb 9.6 oz (89.6 kg)   SpO2 98%   BMI 32.38 kg/m  General appearance: alert, cooperative and fatigued Head: Normocephalic, without obvious abnormality, atraumatic Eyes: conjunctivae/corneas clear. PERRL, EOM's intact. Fundi benign. Ears: normal TM's and external ear canals both ears Nose: no discharge, turbinates swollen, inflamed, no sinus tenderness Throat: lips, mucosa, and tongue normal; teeth and gums normal Lungs: clear to auscultation bilaterally Heart: regular rate and rhythm, S1, S2 normal, no murmur, click, rub or gallop Pulses: 2+ and symmetric Skin: Skin color, texture, turgor normal. No rashes or lesions Lymph nodes: cervical lymphadenopathy, left, submandibular nodes normal  bilaterally Neurologic: Grossly normal    Assessment:  sinusitis and Acute Bronchitis    Plan:  Discussed diagnosis and treatment of sinusitis. Educational material distributed and questions answered. Suggested symptomatic OTC remedies. Supportive care with appropriate antipyretics and fluids. Nasal saline spray for congestion. Zithromax per orders. Nasal steroids per orders. Follow up as needed. Patient will buy OTC Delsym for cough, will use for 5 days then change to honey, cough drops, etc.  Patient will use Flonase Nasal Spray for 5 days.  Discussed medication administration benefit vs. disadvantages due to pregnancy.  Patient verbalized understanding.

## 2017-11-04 ENCOUNTER — Telehealth: Payer: Self-pay | Admitting: Emergency Medicine

## 2017-11-04 NOTE — Telephone Encounter (Signed)
Patient stated she is feeling much better since her visit and thanked me for the call

## 2017-11-16 DIAGNOSIS — Z1329 Encounter for screening for other suspected endocrine disorder: Secondary | ICD-10-CM | POA: Diagnosis not present

## 2017-11-16 DIAGNOSIS — Z23 Encounter for immunization: Secondary | ICD-10-CM | POA: Diagnosis not present

## 2017-11-16 DIAGNOSIS — Z3480 Encounter for supervision of other normal pregnancy, unspecified trimester: Secondary | ICD-10-CM | POA: Diagnosis not present

## 2017-12-04 ENCOUNTER — Encounter: Payer: Self-pay | Admitting: Emergency Medicine

## 2017-12-04 ENCOUNTER — Ambulatory Visit: Payer: Self-pay | Admitting: Emergency Medicine

## 2017-12-04 VITALS — BP 120/62 | HR 115 | Temp 99.8°F | Wt 206.8 lb

## 2017-12-04 DIAGNOSIS — R6889 Other general symptoms and signs: Secondary | ICD-10-CM

## 2017-12-04 DIAGNOSIS — J111 Influenza due to unidentified influenza virus with other respiratory manifestations: Secondary | ICD-10-CM

## 2017-12-04 LAB — POCT INFLUENZA A/B
Influenza A, POC: POSITIVE — AB
Influenza B, POC: POSITIVE — AB

## 2017-12-04 MED ORDER — OSELTAMIVIR PHOSPHATE 75 MG PO CAPS: 75.0000 mg | ORAL_CAPSULE | Freq: Two times a day (BID) | ORAL | 0 refills | Status: DC

## 2017-12-04 NOTE — Progress Notes (Signed)
S: Tanya Richards is a 32 y.o. female who is [redacted] weeks pregnant, referred to our clinic by her OB/GYN for evaluation of flu-like symptoms. She is [id number], without complications. She reports feeling a sudden onset of mylagia, fever, chills, and weakness earlier this afternoon. Denies N/V/D, has some congestion. She has taken some OTC tylenol with minimal relief. Otherwise reports to be in good health  Review of Systems  Constitutional: Positive for chills, fever and malaise/fatigue.  HENT: Positive for congestion and sore throat.   Respiratory: Positive for cough and shortness of breath. Negative for wheezing.   Cardiovascular: Negative for chest pain and palpitations.  Gastrointestinal: Negative for constipation, diarrhea, nausea and vomiting.  Genitourinary: Negative.   Musculoskeletal: Positive for myalgias.  Neurological: Negative for dizziness.   O: Vitals:   12/04/17 1842  BP: 120/62  Pulse: (!) 115  Temp: 99.8 F (37.7 C)  SpO2: 98%   Physical Exam  Constitutional: She appears well-developed and well-nourished. No distress.  HENT:  Head: Normocephalic and atraumatic.  Right Ear: Tympanic membrane and external ear normal.  Left Ear: Tympanic membrane and external ear normal.  Mouth/Throat: Oropharynx is clear and moist.  Eyes: Conjunctivae are normal.  Neck: Normal range of motion.  Cardiovascular: Regular rhythm. Tachycardia present.  Pulmonary/Chest: Effort normal and breath sounds normal. No respiratory distress.  SPO2 measured while pacing, no decrease from baseline   Abdominal: Soft. There is no tenderness.  Lymphadenopathy:    She has cervical adenopathy.  Neurological: She is alert.  Skin: Skin is warm and dry. Capillary refill takes less than 2 seconds. She is not diaphoretic.  Nursing note and vitals reviewed.  Results for orders placed or performed in visit on 12/04/17  POCT Influenza A/B  Result Value Ref Range   Influenza A, POC Positive (A) Negative   Influenza B, POC Positive (A) Negative    A: 1. Flu-like symptoms   2. Influenza     P:  Discussed risks/benefits of treatment, starting Tamiflu for treatment of the flu, continue tylenol Q4-6H, Flonase PRN, follow up with OB/GYN as needed. Recommend treatment at the ER for any worsening of symptoms.

## 2017-12-04 NOTE — Patient Instructions (Signed)

## 2017-12-08 ENCOUNTER — Inpatient Hospital Stay (HOSPITAL_COMMUNITY): Payer: 59

## 2017-12-08 ENCOUNTER — Inpatient Hospital Stay (HOSPITAL_COMMUNITY)
Admission: AD | Admit: 2017-12-08 | Discharge: 2017-12-08 | Disposition: A | Discharge status: Home / Self Care | Payer: 59 | Source: Ambulatory Visit | Attending: Obstetrics and Gynecology | Admitting: Obstetrics and Gynecology

## 2017-12-08 ENCOUNTER — Encounter (HOSPITAL_COMMUNITY): Payer: Self-pay

## 2017-12-08 ENCOUNTER — Other Ambulatory Visit: Payer: Self-pay

## 2017-12-08 DIAGNOSIS — O99513 Diseases of the respiratory system complicating pregnancy, third trimester: Secondary | ICD-10-CM | POA: Insufficient documentation

## 2017-12-08 DIAGNOSIS — J111 Influenza due to unidentified influenza virus with other respiratory manifestations: Secondary | ICD-10-CM | POA: Diagnosis not present

## 2017-12-08 DIAGNOSIS — O9989 Other specified diseases and conditions complicating pregnancy, childbirth and the puerperium: Secondary | ICD-10-CM | POA: Diagnosis not present

## 2017-12-08 DIAGNOSIS — Z3A30 30 weeks gestation of pregnancy: Secondary | ICD-10-CM | POA: Diagnosis not present

## 2017-12-08 DIAGNOSIS — Z880 Allergy status to penicillin: Secondary | ICD-10-CM | POA: Diagnosis not present

## 2017-12-08 DIAGNOSIS — Z7982 Long term (current) use of aspirin: Secondary | ICD-10-CM | POA: Insufficient documentation

## 2017-12-08 DIAGNOSIS — R0602 Shortness of breath: Secondary | ICD-10-CM | POA: Diagnosis not present

## 2017-12-08 DIAGNOSIS — J069 Acute upper respiratory infection, unspecified: Secondary | ICD-10-CM

## 2017-12-08 DIAGNOSIS — Z888 Allergy status to other drugs, medicaments and biological substances status: Secondary | ICD-10-CM | POA: Insufficient documentation

## 2017-12-08 DIAGNOSIS — R05 Cough: Secondary | ICD-10-CM | POA: Diagnosis present

## 2017-12-08 LAB — URINALYSIS, ROUTINE W REFLEX MICROSCOPIC
Bilirubin Urine: NEGATIVE
Glucose, UA: NEGATIVE mg/dL
Ketones, ur: NEGATIVE mg/dL
Nitrite: NEGATIVE
Protein, ur: NEGATIVE mg/dL
Specific Gravity, Urine: 1.013 (ref 1.005–1.030)
pH: 7 (ref 5.0–8.0)

## 2017-12-08 LAB — COMPREHENSIVE METABOLIC PANEL
ALT: 17 U/L (ref 14–54)
AST: 17 U/L (ref 15–41)
Albumin: 2.6 g/dL — ABNORMAL LOW (ref 3.5–5.0)
Alkaline Phosphatase: 76 U/L (ref 38–126)
Anion gap: 6 (ref 5–15)
BUN: 12 mg/dL (ref 6–20)
CO2: 23 mmol/L (ref 22–32)
Calcium: 8.3 mg/dL — ABNORMAL LOW (ref 8.9–10.3)
Chloride: 106 mmol/L (ref 101–111)
Creatinine, Ser: 0.47 mg/dL (ref 0.44–1.00)
GFR calc Af Amer: >60 mL/min (ref >60)
GFR calc non Af Amer: >60 mL/min (ref >60)
Glucose, Bld: 88 mg/dL (ref 65–99)
Potassium: 4.1 mmol/L (ref 3.5–5.1)
Sodium: 135 mmol/L (ref 135–145)
Total Bilirubin: 0.8 mg/dL (ref 0.3–1.2)
Total Protein: 5.9 g/dL — ABNORMAL LOW (ref 6.5–8.1)

## 2017-12-08 LAB — CBC
HCT: 35.2 % — ABNORMAL LOW (ref 36.0–46.0)
Hemoglobin: 11.7 g/dL — ABNORMAL LOW (ref 12.0–15.0)
MCH: 31 pg (ref 26.0–34.0)
MCHC: 33.2 g/dL (ref 30.0–36.0)
MCV: 93.4 fL (ref 78.0–100.0)
Platelets: 254 10*3/uL (ref 150–400)
RBC: 3.77 MIL/uL — ABNORMAL LOW (ref 3.87–5.11)
RDW: 12.6 % (ref 11.5–15.5)
WBC: 8 10*3/uL (ref 4.0–10.5)

## 2017-12-08 NOTE — Progress Notes (Addendum)
G2P1 @ [redacted] wksga. Here dt wheezing. Was dx with Flue A/B on Tuesday. Denies LOF or bleeding. +FM. EFM appled.   Provider at [facility] assessing.   Ordered for labs and chest xray  Pt to xray after labs.  1801: Back from xay  1836: provider d/c pt to home  1842: d/c instructions given with pt understanding. Pt left unit via ambulatory.

## 2017-12-08 NOTE — MAU Provider Note (Signed)
History     CSN: [id number]  Arrival date and time: 12/08/17 1646   None     Chief Complaint  Patient presents with  . Cough  . Shortness of Breath   32 yo G2P1001 at [redacted]w[redacted]d presents with the flu diagnosed 4 days ago. She has a new symptom of wheezing in her chest x 1 day. She feels worse overall but prior to today was improving. She is taking tamiflu. Admits to associated cough but denies fever, sweats, chills. No history of asthma or other lung disease.     Past Medical History:  Diagnosis Date  . Anxiety   . Immunization, BCG   . Infertility, female   . Seizures ([place])    as a child for 1 year    Past Surgical History:  Procedure Laterality Date  . WISDOM TOOTH EXTRACTION      Family History  Problem Relation Age of Onset  . Hypertension Father   . Hyperlipidemia Father   . Diabetes Paternal Grandmother   . Cancer Paternal Grandmother        pancreatic  . Diabetes Paternal Grandfather   . Depression Mother   . Depression Maternal Grandmother   . Cancer Maternal Grandmother        colon    Social History   Tobacco Use  . Smoking status: Never Smoker  . Smokeless tobacco: Never Used  Substance Use Topics  . Alcohol use: No  . Drug use: No    Allergies:  Allergies  Allergen Reactions  . Augmentin [Amoxicillin-Pot Clavulanate] Other (See Comments)    Pt states that she had "really bad nightmares" while using this.   . Benadryl [Diphenhydramine] Other (See Comments)    Pt states that she had "really bad nightmares" while using this.    Medications Prior to Admission  Medication Sig Dispense Refill Last Dose  . aspirin EC 81 MG tablet Take 81 mg by mouth daily.     . cetirizine (ZYRTEC) 10 MG tablet Take 10 mg by mouth daily as needed for allergies.   Taking  . fluticasone (FLONASE) 50 MCG/ACT nasal spray Place 1 spray into both nostrils daily as needed for allergies or rhinitis.   Taking  . fluticasone (FLONASE) 50 MCG/ACT nasal spray Place 2 sprays  into both nostrils daily for 5 days. 16 g 0   . levothyroxine (SYNTHROID, LEVOTHROID) 50 MCG tablet Take 50 mcg by mouth daily before breakfast.     . oseltamivir (TAMIFLU) 75 MG capsule Take 1 capsule (75 mg total) by mouth 2 (two) times daily. 10 capsule 0   . Prenatal Vit-Fe Fumarate-FA (PRENATAL MULTIVITAMIN) TABS tablet Take 1 tablet by mouth daily.    Not Taking    Review of Systems  Constitutional: Positive for fatigue. Negative for activity change.  HENT: Negative for congestion and ear pain.   Eyes: Negative for discharge and itching.  Respiratory: Positive for cough and wheezing.   Gastrointestinal: Negative for abdominal distention and abdominal pain.  Endocrine: Negative for cold intolerance and heat intolerance.  Genitourinary: Negative for difficulty urinating and dysuria.  Musculoskeletal: Negative for arthralgias and back pain.  Neurological: Negative for dizziness and light-headedness.  Hematological: Negative for adenopathy. Does not bruise/bleed easily.   Physical Exam   Blood pressure 107/68, pulse 67, temperature (!) 97.5 F (36.4 C), temperature source Oral, resp. rate 18, height 5\' 7"  (1.702 m), weight 93.6 kg (206 lb 4 oz), SpO2 100 %, unknown if currently breastfeeding.  Physical Exam  Constitutional: She is oriented to person, place, and time. She appears well-developed and well-nourished.  HENT:  Head: Normocephalic and atraumatic.  Eyes: Conjunctivae are normal. Pupils are equal, round, and reactive to light.  Neck: Normal range of motion. Neck supple.  Cardiovascular: Normal rate and intact distal pulses.  Respiratory: Effort normal. No respiratory distress.  BLL rhonchi. No wheezes  GI: Soft. She exhibits no distension. There is no tenderness.  Musculoskeletal: Normal range of motion.  Neurological: She is alert and oriented to person, place, and time.  Skin: Skin is warm and dry.  Psychiatric: She has a normal mood and affect. Her behavior is normal.     MAU Course  Procedures  EFM 140 bpm/mod var/pos acels/no decels- cat 1  MDM Patient is well appearing on exam and vitals are wnl so no suspicion for sepsis. However her lung exam makes me worry for pneumonia. Will get cxr, cbc, and cmp.   CXR wnl. WBC was 8. Discussed with Dr. Helane Rima. Discharge home in stable condition.   Assessment and Plan  1. Pregnancy with 30 weeks completed gestation- follow up on Monday outpatient 2. Flu- continue tamiflu 3. Viral upper respiratory infection- continue supportive care. Given return precautions.    [employer] 12/08/2017, 6:31 PM

## 2017-12-08 NOTE — MAU Note (Addendum)
Pt presents with c/o worsening cough, wheezing, and  SOB.  Reports diagnosed with Flu A & B Tuesday evening, currently taking Tamiflu. Reports +FM

## 2017-12-08 NOTE — Discharge Instructions (Signed)
Cool Mist Vaporizer A cool mist vaporizer is a device that releases a cool mist into the air. If you have a cough or a cold, using a vaporizer may help relieve your symptoms. The mist adds moisture to the air, which may help thin your mucus and make it less sticky. When your mucus is thin and less sticky, it easier for you to breathe and to cough up secretions. Do not use a vaporizer if you are allergic to mold. Follow these instructions at home:  Follow the instructions that come with the vaporizer.  Do not use anything other than distilled water in the vaporizer.  Do not run the vaporizer all of the time. Doing that can cause mold or bacteria to grow in the vaporizer.  Clean the vaporizer after each time that you use it.  Clean and dry the vaporizer well before storing it.  Stop using the vaporizer if your breathing symptoms get worse. This information is not intended to replace advice given to you by your health care provider. Make sure you discuss any questions you have with your health care provider. Document Released: 06/29/2004 Document Revised: 04/21/2016 Document Reviewed: 01/01/2016 Elsevier Interactive Patient Education  2018 Elsevier Inc.   Upper Respiratory Infection, Adult Most upper respiratory infections (URIs) are caused by a virus. A URI affects the nose, throat, and upper air passages. The most common type of URI is often called "the common cold." Follow these instructions at home:  Take medicines only as told by your doctor.  Gargle warm saltwater or take cough drops to comfort your throat as told by your doctor.  Use a warm mist humidifier or inhale steam from a shower to increase air moisture. This may make it easier to breathe.  Drink enough fluid to keep your pee (urine) clear or pale yellow.  Eat soups and other clear broths.  Have a healthy diet.  Rest as needed.  Go back to work when your fever is gone or your doctor says it is okay. ? You may need  to stay home longer to avoid giving your URI to others. ? You can also wear a face mask and wash your hands often to prevent spread of the virus.  Use your inhaler more if you have asthma.  Do not use any tobacco products, including cigarettes, chewing tobacco, or electronic cigarettes. If you need help quitting, ask your doctor. Contact a doctor if:  You are getting worse, not better.  Your symptoms are not helped by medicine.  You have chills.  You are getting more short of breath.  You have brown or red mucus.  You have yellow or brown discharge from your nose.  You have pain in your face, especially when you bend forward.  You have a fever.  You have puffy (swollen) neck glands.  You have pain while swallowing.  You have white areas in the back of your throat. Get help right away if:  You have very bad or constant: ? Headache. ? Ear pain. ? Pain in your forehead, behind your eyes, and over your cheekbones (sinus pain). ? Chest pain.  You have long-lasting (chronic) lung disease and any of the following: ? Wheezing. ? Long-lasting cough. ? Coughing up blood. ? A change in your usual mucus.  You have a stiff neck.  You have changes in your: ? Vision. ? Hearing. ? Thinking. ? Mood. This information is not intended to replace advice given to you by your health care provider. Make   sure you discuss any questions you have with your health care provider. Document Released: 03/20/2008 Document Revised: 06/04/2016 Document Reviewed: 01/07/2014 Elsevier Interactive Patient Education  2018 Elsevier Inc.   

## 2018-01-29 ENCOUNTER — Encounter (HOSPITAL_COMMUNITY): Payer: Self-pay

## 2018-01-29 ENCOUNTER — Telehealth (HOSPITAL_COMMUNITY): Payer: Self-pay | Admitting: *Deleted

## 2018-01-29 NOTE — Telephone Encounter (Signed)
Preadmission screen  

## 2018-01-30 ENCOUNTER — Encounter (HOSPITAL_COMMUNITY): Payer: Self-pay

## 2018-01-30 DIAGNOSIS — Z3480 Encounter for supervision of other normal pregnancy, unspecified trimester: Secondary | ICD-10-CM | POA: Diagnosis not present

## 2018-01-30 NOTE — H&P (Addendum)
Tanya Richards is a 32 y.o. female presenting for primary CS s/s desiring removal of R ovarian likely dermoid. OB History    Gravida  2   Para  1   Term  1   Preterm      AB      Living  1     SAB      TAB      Ectopic      Multiple      Live Births  1          Past Medical History:  Diagnosis Date  . Anxiety   . Dermoid cyst   . Hypothyroidism   . Immunization, BCG   . Infertility, female   . Seizures ([place])    as a child for 1 year   Past Surgical History:  Procedure Laterality Date  . WISDOM TOOTH EXTRACTION     Family History: family history includes Cancer in her maternal grandmother and paternal grandmother; Depression in her maternal grandmother and mother; Diabetes in her paternal grandfather and paternal grandmother; Hyperlipidemia in her father, paternal grandmother, and paternal uncle; Hypertension in her father. Social History:  reports that she has never smoked. She has never used smokeless tobacco. She reports that she does not drink alcohol or use drugs.     Maternal Diabetes: No Genetic Screening: Normal Maternal Ultrasounds/Referrals: Abnormal:  Findings:   Other: R ovarian cyst - 6cm, likely dermoid Fetal Ultrasounds or other Referrals:  None Maternal Substance Abuse:  No Significant Maternal Medications:  None Significant Maternal Lab Results:  None Other Comments:  None  ROS History   unknown if currently breastfeeding. Exam Physical Exam  (from clinic) NAD, A&O NWOB Abd soft, nondistended, gravid  Prenatal labs: ABO, Rh: A/Positive/-- (09/18 0000) Antibody: Negative (09/18 0000) Rubella: Immune (09/18 0000) RPR: Nonreactive (09/18 0000)  HBsAg: Negative (09/18 0000)  HIV: Non-reactive (09/18 0000)  GBS:     Assessment/Plan: 32 yo G2P1001 presenting @ term for primary CS with R ovarian cystectomy. She has a h/o prior NSVD but elects to undergo cesarean section in order to remove a dermoid on her R ovary. She was  extensively counseled regarding her options including vaginal delivery with postpartum removal at 6 weeks and pt strongly opts for primary c section with removal at that time. Understands risk of potential RSO, longer surgery, increased blood loss, etc. Please see office documentation for entire counseling session. Risks of a c section reviewed including infection, bleeding, damage to surrounding structures, the need for additional procedures including hysterectomy and the possibility of uterine rupture/abnl placentation with future pregnancies. Patient agrees to proceed. Gent/Clinda on call to OR as she has an allergy to amoxicillin.  Hypothyroid - cont synthroid 50mg  Expecting baby girl Tanya Richards.    Tyson Dense 01/30/2018, 9:40 AM   No updates to above H&P. Patient agrees to proceed.    Arty Baumgartner MD

## 2018-02-08 NOTE — Patient Instructions (Signed)
Tanya Richards  02/08/2018   Your procedure is scheduled on:  02/12/2018  Enter through the Main Entrance of [facility] at [place] up the phone at the desk and dial [phone]  Call this number if you have problems the morning of surgery:[phone]  Remember:   Do not eat food:(After Midnight) Desps de medianoche.  Do not drink clear liquids: (After Midnight) Desps de medianoche.  Take these medicines the morning of surgery with A SIP OF WATER: synthroid   Do not wear jewelry, make-up or nail polish.  Do not wear lotions, powders, or perfumes. Do not wear deodorant.  Do not shave 48 hours prior to surgery.  Do not bring valuables to the hospital.  [facility] is not   responsible for any belongings or valuables brought to the hospital.  Contacts, dentures or bridgework may not be worn into surgery.  Leave suitcase in the car. After surgery it may be brought to your room.  For patients admitted to the hospital, checkout time is 11:00 AM the day of              discharge.    N/A   Please read over the following fact sheets that you were given:   Surgical Site Infection Prevention

## 2018-02-11 ENCOUNTER — Encounter (HOSPITAL_COMMUNITY)
Admission: RE | Admit: 2018-02-11 | Discharge: 2018-02-11 | Disposition: A | Discharge status: Home / Self Care | Payer: 59 | Source: Ambulatory Visit | Attending: Obstetrics and Gynecology | Admitting: Obstetrics and Gynecology

## 2018-02-11 HISTORY — DX: Benign neoplasm, unspecified site: D36.9

## 2018-02-11 HISTORY — DX: Hypothyroidism, unspecified: E03.9

## 2018-02-11 LAB — CBC
HCT: 37.7 % (ref 36.0–46.0)
Hemoglobin: 12.8 g/dL (ref 12.0–15.0)
MCH: 30.9 pg (ref 26.0–34.0)
MCHC: 34 g/dL (ref 30.0–36.0)
MCV: 91.1 fL (ref 78.0–100.0)
Platelets: 262 10*3/uL (ref 150–400)
RBC: 4.14 MIL/uL (ref 3.87–5.11)
RDW: 13.4 % (ref 11.5–15.5)
WBC: 8.3 10*3/uL (ref 4.0–10.5)

## 2018-02-11 LAB — TYPE AND SCREEN
ABO/RH(D): A POS
Antibody Screen: NEGATIVE

## 2018-02-11 LAB — ABO/RH: ABO/RH(D): A POS

## 2018-02-12 ENCOUNTER — Encounter (HOSPITAL_COMMUNITY): Payer: Self-pay | Admitting: *Deleted

## 2018-02-12 ENCOUNTER — Inpatient Hospital Stay (HOSPITAL_COMMUNITY): Payer: 59

## 2018-02-12 ENCOUNTER — Inpatient Hospital Stay (HOSPITAL_COMMUNITY)
Admission: AD | Admit: 2018-02-12 | Discharge: 2018-02-14 | DRG: 788 | Disposition: A | Discharge status: Home / Self Care | Payer: 59 | Source: Ambulatory Visit | Attending: Obstetrics and Gynecology | Admitting: Obstetrics and Gynecology

## 2018-02-12 ENCOUNTER — Encounter (HOSPITAL_COMMUNITY)
Admission: AD | Disposition: A | Discharge status: Home / Self Care | Payer: Self-pay | Source: Ambulatory Visit | Attending: Obstetrics and Gynecology

## 2018-02-12 DIAGNOSIS — O3483 Maternal care for other abnormalities of pelvic organs, third trimester: Principal | ICD-10-CM | POA: Diagnosis present

## 2018-02-12 DIAGNOSIS — Z3A39 39 weeks gestation of pregnancy: Secondary | ICD-10-CM

## 2018-02-12 DIAGNOSIS — O99214 Obesity complicating childbirth: Secondary | ICD-10-CM | POA: Diagnosis present

## 2018-02-12 DIAGNOSIS — Z880 Allergy status to penicillin: Secondary | ICD-10-CM

## 2018-02-12 DIAGNOSIS — E039 Hypothyroidism, unspecified: Secondary | ICD-10-CM | POA: Diagnosis present

## 2018-02-12 DIAGNOSIS — O99284 Endocrine, nutritional and metabolic diseases complicating childbirth: Secondary | ICD-10-CM | POA: Diagnosis present

## 2018-02-12 DIAGNOSIS — D270 Benign neoplasm of right ovary: Secondary | ICD-10-CM | POA: Diagnosis present

## 2018-02-12 DIAGNOSIS — Z3490 Encounter for supervision of normal pregnancy, unspecified, unspecified trimester: Secondary | ICD-10-CM

## 2018-02-12 DIAGNOSIS — E669 Obesity, unspecified: Secondary | ICD-10-CM | POA: Diagnosis present

## 2018-02-12 DIAGNOSIS — Z98891 History of uterine scar from previous surgery: Secondary | ICD-10-CM

## 2018-02-12 DIAGNOSIS — N83201 Unspecified ovarian cyst, right side: Secondary | ICD-10-CM | POA: Diagnosis not present

## 2018-02-12 LAB — RPR: RPR Ser Ql: NONREACTIVE

## 2018-02-12 SURGERY — Surgical Case
Anesthesia: Spinal | Laterality: Right

## 2018-02-12 MED ORDER — SOD CITRATE-CITRIC ACID 500-334 MG/5ML PO SOLN: 30.0000 mL | ORAL | Status: DC

## 2018-02-12 MED ORDER — LACTATED RINGERS IV SOLN
INTRAVENOUS | Status: DC
  Administered 2018-02-12: 22:00:00 via INTRAVENOUS

## 2018-02-12 MED ORDER — FENTANYL CITRATE (PF) 100 MCG/2ML IJ SOLN: 25.0000 ug | INTRAMUSCULAR | Status: DC | PRN

## 2018-02-12 MED ORDER — SIMETHICONE 80 MG PO CHEW
80.0000 mg | CHEWABLE_TABLET | ORAL | Status: DC
  Administered 2018-02-13 (×2): 80 mg via ORAL
  Filled 2018-02-12 (×2): qty 1

## 2018-02-12 MED ORDER — ONDANSETRON HCL 4 MG/2ML IJ SOLN
INTRAMUSCULAR | Status: DC | PRN
  Administered 2018-02-12: 4 mg via INTRAVENOUS

## 2018-02-12 MED ORDER — NALBUPHINE HCL 10 MG/ML IJ SOLN: 5.0000 mg | Freq: Once | INTRAMUSCULAR | Status: DC | PRN

## 2018-02-12 MED ORDER — LACTATED RINGERS IV SOLN
INTRAVENOUS | Status: DC
  Administered 2018-02-12: 12:00:00 via INTRAVENOUS

## 2018-02-12 MED ORDER — PHENYLEPHRINE 8 MG IN D5W 100 ML (0.08MG/ML) PREMIX OPTIME
INJECTION | INTRAVENOUS | Status: DC | PRN
  Administered 2018-02-12: 30 ug/min via INTRAVENOUS

## 2018-02-12 MED ORDER — IBUPROFEN 600 MG PO TABS
600.0000 mg | ORAL_TABLET | Freq: Four times a day (QID) | ORAL | Status: DC
  Administered 2018-02-13 – 2018-02-14 (×7): 600 mg via ORAL
  Filled 2018-02-12 (×7): qty 1

## 2018-02-12 MED ORDER — DEXAMETHASONE SODIUM PHOSPHATE 4 MG/ML IJ SOLN
INTRAMUSCULAR | Status: DC | PRN
  Administered 2018-02-12: 4 mg via INTRAVENOUS

## 2018-02-12 MED ORDER — ONDANSETRON HCL 4 MG/2ML IJ SOLN: 4.0000 mg | Freq: Three times a day (TID) | INTRAMUSCULAR | Status: DC | PRN

## 2018-02-12 MED ORDER — MENTHOL 3 MG MT LOZG: 1.0000 | LOZENGE | OROMUCOSAL | Status: DC | PRN

## 2018-02-12 MED ORDER — FENTANYL CITRATE (PF) 100 MCG/2ML IJ SOLN
INTRAMUSCULAR | Status: DC | PRN
  Administered 2018-02-12: 20 ug via INTRATHECAL

## 2018-02-12 MED ORDER — KETOROLAC TROMETHAMINE 30 MG/ML IJ SOLN: 30.0000 mg | Freq: Four times a day (QID) | INTRAMUSCULAR | Status: AC | PRN

## 2018-02-12 MED ORDER — FLUTICASONE PROPIONATE 50 MCG/ACT NA SUSP
2.0000 | Freq: Every day | NASAL | Status: DC
  Administered 2018-02-13: 2 via NASAL
  Filled 2018-02-12: qty 16

## 2018-02-12 MED ORDER — NALBUPHINE HCL 10 MG/ML IJ SOLN: 5.0000 mg | INTRAMUSCULAR | Status: DC | PRN

## 2018-02-12 MED ORDER — OXYCODONE HCL 5 MG PO TABS
10.0000 mg | ORAL_TABLET | ORAL | Status: DC | PRN
  Administered 2018-02-14: 10 mg via ORAL
  Filled 2018-02-12: qty 2

## 2018-02-12 MED ORDER — ONDANSETRON HCL 4 MG/2ML IJ SOLN
INTRAMUSCULAR | Status: AC
  Filled 2018-02-12: qty 2

## 2018-02-12 MED ORDER — ACETAMINOPHEN 325 MG PO TABS
650.0000 mg | ORAL_TABLET | ORAL | Status: DC | PRN
  Administered 2018-02-13: 650 mg via ORAL
  Filled 2018-02-12: qty 2

## 2018-02-12 MED ORDER — GENTAMICIN SULFATE 40 MG/ML IJ SOLN
475.0000 mg | INTRAVENOUS | Status: AC
  Administered 2018-02-12: 480 mg via INTRAVENOUS
  Filled 2018-02-12: qty 12

## 2018-02-12 MED ORDER — OXYCODONE HCL 5 MG PO TABS
5.0000 mg | ORAL_TABLET | ORAL | Status: DC | PRN
  Administered 2018-02-13 – 2018-02-14 (×3): 5 mg via ORAL
  Filled 2018-02-12 (×3): qty 1

## 2018-02-12 MED ORDER — OXYTOCIN 10 UNIT/ML IJ SOLN
INTRAMUSCULAR | Status: AC
  Filled 2018-02-12: qty 4

## 2018-02-12 MED ORDER — COCONUT OIL OIL: 1.0000 "application " | TOPICAL_OIL | Status: DC | PRN

## 2018-02-12 MED ORDER — SENNOSIDES-DOCUSATE SODIUM 8.6-50 MG PO TABS
2.0000 | ORAL_TABLET | ORAL | Status: DC
  Administered 2018-02-13 (×2): 2 via ORAL
  Filled 2018-02-12 (×2): qty 2

## 2018-02-12 MED ORDER — SIMETHICONE 80 MG PO CHEW
80.0000 mg | CHEWABLE_TABLET | Freq: Three times a day (TID) | ORAL | Status: DC
  Administered 2018-02-13 – 2018-02-14 (×5): 80 mg via ORAL
  Filled 2018-02-12 (×5): qty 1

## 2018-02-12 MED ORDER — OXYTOCIN 10 UNIT/ML IJ SOLN
INTRAMUSCULAR | Status: DC | PRN
  Administered 2018-02-12: 40 [IU] via INTRAVENOUS

## 2018-02-12 MED ORDER — LORATADINE 10 MG PO TABS
10.0000 mg | ORAL_TABLET | Freq: Every day | ORAL | Status: DC
  Filled 2018-02-12: qty 1

## 2018-02-12 MED ORDER — CLINDAMYCIN PHOSPHATE 900 MG/50ML IV SOLN
900.0000 mg | INTRAVENOUS | Status: AC
  Administered 2018-02-12: 900 mg via INTRAVENOUS
  Filled 2018-02-12 (×2): qty 50

## 2018-02-12 MED ORDER — PRENATAL MULTIVITAMIN CH
1.0000 | ORAL_TABLET | Freq: Every day | ORAL | Status: DC
  Administered 2018-02-13 – 2018-02-14 (×2): 1 via ORAL
  Filled 2018-02-12 (×2): qty 1

## 2018-02-12 MED ORDER — LACTATED RINGERS IV SOLN
INTRAVENOUS | Status: DC | PRN
  Administered 2018-02-12 (×2): via INTRAVENOUS

## 2018-02-12 MED ORDER — SCOPOLAMINE 1 MG/3DAYS TD PT72
1.0000 | MEDICATED_PATCH | Freq: Once | TRANSDERMAL | Status: DC
  Administered 2018-02-12: 1.5 mg via TRANSDERMAL
  Filled 2018-02-12: qty 1

## 2018-02-12 MED ORDER — BUPIVACAINE IN DEXTROSE 0.75-8.25 % IT SOLN
INTRATHECAL | Status: AC
  Filled 2018-02-12: qty 2

## 2018-02-12 MED ORDER — TETANUS-DIPHTH-ACELL PERTUSSIS 5-2.5-18.5 LF-MCG/0.5 IM SUSP: 0.5000 mL | Freq: Once | INTRAMUSCULAR | Status: DC

## 2018-02-12 MED ORDER — MORPHINE SULFATE (PF) 0.5 MG/ML IJ SOLN
INTRAMUSCULAR | Status: DC | PRN
  Administered 2018-02-12: .2 ug via INTRATHECAL

## 2018-02-12 MED ORDER — WITCH HAZEL-GLYCERIN EX PADS: 1.0000 "application " | MEDICATED_PAD | CUTANEOUS | Status: DC | PRN

## 2018-02-12 MED ORDER — DEXAMETHASONE SODIUM PHOSPHATE 4 MG/ML IJ SOLN
INTRAMUSCULAR | Status: AC
  Filled 2018-02-12: qty 1

## 2018-02-12 MED ORDER — OXYTOCIN 40 UNITS IN LACTATED RINGERS INFUSION - SIMPLE MED: 2.5000 [IU]/h | INTRAVENOUS | Status: AC

## 2018-02-12 MED ORDER — DIBUCAINE 1 % RE OINT: 1.0000 "application " | TOPICAL_OINTMENT | RECTAL | Status: DC | PRN

## 2018-02-12 MED ORDER — NALOXONE HCL 4 MG/10ML IJ SOLN: 1.0000 ug/kg/h | INTRAVENOUS | Status: DC | PRN

## 2018-02-12 MED ORDER — FENTANYL CITRATE (PF) 100 MCG/2ML IJ SOLN
INTRAMUSCULAR | Status: AC
  Filled 2018-02-12: qty 2

## 2018-02-12 MED ORDER — BUPIVACAINE IN DEXTROSE 0.75-8.25 % IT SOLN
INTRATHECAL | Status: DC | PRN
  Administered 2018-02-12: 1.6 mL via INTRATHECAL

## 2018-02-12 MED ORDER — ZOLPIDEM TARTRATE 5 MG PO TABS: 5.0000 mg | ORAL_TABLET | Freq: Every evening | ORAL | Status: DC | PRN

## 2018-02-12 MED ORDER — SOD CITRATE-CITRIC ACID 500-334 MG/5ML PO SOLN
30.0000 mL | Freq: Once | ORAL | Status: AC
  Administered 2018-02-12: 30 mL via ORAL
  Filled 2018-02-12: qty 15

## 2018-02-12 MED ORDER — LEVOTHYROXINE SODIUM 50 MCG PO TABS
50.0000 ug | ORAL_TABLET | Freq: Every day | ORAL | Status: DC
  Administered 2018-02-13 – 2018-02-14 (×2): 50 ug via ORAL
  Filled 2018-02-12 (×2): qty 1

## 2018-02-12 MED ORDER — SODIUM CHLORIDE 0.9% FLUSH: 3.0000 mL | INTRAVENOUS | Status: DC | PRN

## 2018-02-12 MED ORDER — KETOROLAC TROMETHAMINE 30 MG/ML IJ SOLN
30.0000 mg | Freq: Four times a day (QID) | INTRAMUSCULAR | Status: AC | PRN
  Administered 2018-02-12: 30 mg via INTRAMUSCULAR

## 2018-02-12 MED ORDER — NALOXONE HCL 0.4 MG/ML IJ SOLN: 0.4000 mg | INTRAMUSCULAR | Status: DC | PRN

## 2018-02-12 MED ORDER — KETOROLAC TROMETHAMINE 30 MG/ML IJ SOLN
INTRAMUSCULAR | Status: AC
  Filled 2018-02-12: qty 1

## 2018-02-12 MED ORDER — SIMETHICONE 80 MG PO CHEW: 80.0000 mg | CHEWABLE_TABLET | ORAL | Status: DC | PRN

## 2018-02-12 MED ORDER — MEPERIDINE HCL 25 MG/ML IJ SOLN
6.2500 mg | INTRAMUSCULAR | Status: DC | PRN
Start: 2018-02-12 — End: 2018-02-12

## 2018-02-12 MED ORDER — MORPHINE SULFATE (PF) 0.5 MG/ML IJ SOLN
INTRAMUSCULAR | Status: AC
  Filled 2018-02-12: qty 10

## 2018-02-12 SURGICAL SUPPLY — 38 items
BENZOIN TINCTURE PRP APPL 2/3 (GAUZE/BANDAGES/DRESSINGS) ×2 IMPLANT
CHLORAPREP W/TINT 26ML (MISCELLANEOUS) ×2 IMPLANT
CLAMP CORD UMBIL (MISCELLANEOUS) IMPLANT
CLOSURE STERI STRIP 1/2 X4 (GAUZE/BANDAGES/DRESSINGS) ×2 IMPLANT
CLOTH BEACON ORANGE TIMEOUT ST (SAFETY) ×2 IMPLANT
DERMABOND ADVANCED (GAUZE/BANDAGES/DRESSINGS) ×1
DERMABOND ADVANCED .7 DNX12 (GAUZE/BANDAGES/DRESSINGS) ×1 IMPLANT
DRSG OPSITE POSTOP 4X10 (GAUZE/BANDAGES/DRESSINGS) ×2 IMPLANT
ELECT REM PT RETURN 9FT ADLT (ELECTROSURGICAL) ×2
ELECTRODE REM PT RTRN 9FT ADLT (ELECTROSURGICAL) ×1 IMPLANT
EXTRACTOR VACUUM KIWI (MISCELLANEOUS) IMPLANT
GAUZE SPONGE 4X4 12PLY STRL LF (GAUZE/BANDAGES/DRESSINGS) ×2 IMPLANT
GLOVE BIO SURGEON STRL SZ 6.5 (GLOVE) ×2 IMPLANT
GLOVE BIOGEL PI IND STRL 6.5 (GLOVE) ×1 IMPLANT
GLOVE BIOGEL PI IND STRL 7.0 (GLOVE) ×2 IMPLANT
GLOVE BIOGEL PI INDICATOR 6.5 (GLOVE) ×1
GLOVE BIOGEL PI INDICATOR 7.0 (GLOVE) ×2
GOWN STRL REUS W/TWL LRG LVL3 (GOWN DISPOSABLE) ×4 IMPLANT
KIT ABG SYR 3ML LUER SLIP (SYRINGE) ×2 IMPLANT
NEEDLE HYPO 25X5/8 SAFETYGLIDE (NEEDLE) ×2 IMPLANT
NS IRRIG 1000ML POUR BTL (IV SOLUTION) ×2 IMPLANT
PACK C SECTION WH (CUSTOM PROCEDURE TRAY) ×2 IMPLANT
PAD ABD 7.5X8 STRL (GAUZE/BANDAGES/DRESSINGS) ×4 IMPLANT
PAD OB MATERNITY 4.3X12.25 (PERSONAL CARE ITEMS) ×2 IMPLANT
PENCIL SMOKE EVAC W/HOLSTER (ELECTROSURGICAL) ×2 IMPLANT
RETRACTOR WND ALEXIS 25 LRG (MISCELLANEOUS) IMPLANT
RTRCTR WOUND ALEXIS 25CM LRG (MISCELLANEOUS)
SPONGE LAP 18X18 RF (DISPOSABLE) ×2 IMPLANT
SUT PLAIN 0 NONE (SUTURE) IMPLANT
SUT PLAIN 2 0 (SUTURE) ×1
SUT PLAIN ABS 2-0 CT1 27XMFL (SUTURE) ×1 IMPLANT
SUT VIC AB 0 CT1 36 (SUTURE) ×2 IMPLANT
SUT VIC AB 0 CTX 36 (SUTURE) ×2
SUT VIC AB 0 CTX36XBRD ANBCTRL (SUTURE) ×2 IMPLANT
SUT VIC AB 4-0 PS2 27 (SUTURE) ×4 IMPLANT
SYR 10ML LL (SYRINGE) ×2 IMPLANT
TOWEL OR 17X24 6PK STRL BLUE (TOWEL DISPOSABLE) ×2 IMPLANT
TRAY FOLEY W/BAG SLVR 14FR LF (SET/KITS/TRAYS/PACK) IMPLANT

## 2018-02-12 NOTE — Anesthesia Procedure Notes (Signed)
Spinal  Patient location during procedure: OR Start time: 02/12/2018 12:58 PM Staffing Anesthesiologist: Josephine Igo, MD Performed: anesthesiologist  Preanesthetic Checklist Completed: patient identified, site marked, surgical consent, pre-op evaluation, timeout performed, IV checked, risks and benefits discussed and monitors and equipment checked Spinal Block Patient position: sitting Prep: site prepped and draped and DuraPrep Patient monitoring: cardiac monitor, continuous pulse ox, blood pressure and heart rate Approach: midline Location: L3-4 Injection technique: catheter Needle Needle type: Tuohy and Pencan  Needle gauge: 24 G Needle length: 12.7 cm Needle insertion depth: 7 cm Catheter type: closed end flexible Catheter size: 19 g Catheter at skin depth: 11 cm Assessment Sensory level: T4 Additional Notes Epidural space ID'd as above LOR with air. No CSF, heme or paresthesias. SAB performed through epidural needle. CSF clear with free flow, no paresthesias. LA + narcotics injected. Spinal needle withdrawn and Epidural catheter threaded 5 cm into the epidural space. Sterile dressing applied. Patient placed supine with LUD. Patient tolerated procedure well. Adequate sensory level.

## 2018-02-12 NOTE — Anesthesia Preprocedure Evaluation (Signed)
Anesthesia Evaluation  Patient identified by MRN, date of birth, ID band Patient awake    Reviewed: Allergy & Precautions, NPO status , Patient's Chart, lab work & pertinent test results  Airway Mallampati: II  TM Distance: >3 FB Neck ROM: Full    Dental no notable dental hx. (+) Teeth Intact   Pulmonary    Pulmonary exam normal breath sounds clear to auscultation       Cardiovascular negative cardio ROS Normal cardiovascular exam Rhythm:Regular Rate:Normal     Neuro/Psych Seizures -, Well Controlled,  PSYCHIATRIC DISORDERS    GI/Hepatic Neg liver ROS, GERD  ,  Endo/Other  Hypothyroidism Obesity Right ovarian cyst  Renal/GU negative Renal ROS  negative genitourinary   Musculoskeletal negative musculoskeletal ROS (+)   Abdominal (+) + obese,   Peds  Hematology negative hematology ROS (+)   Anesthesia Other Findings   Reproductive/Obstetrics (+) Pregnancy                             Anesthesia Physical Anesthesia Plan  ASA: II  Anesthesia Plan: Combined Spinal and Epidural   Post-op Pain Management:    Induction:   PONV Risk Score and Plan: 4 or greater and Ondansetron, Dexamethasone, Treatment may vary due to age or medical condition and Scopolamine patch - Pre-op  Airway Management Planned: Natural Airway  Additional Equipment:   Intra-op Plan:   Post-operative Plan:   Informed Consent: I have reviewed the patients History and Physical, chart, labs and discussed the procedure including the risks, benefits and alternatives for the proposed anesthesia with the patient or authorized representative who has indicated his/her understanding and acceptance.   Dental advisory given  Plan Discussed with: Anesthesiologist, Surgeon and CRNA  Anesthesia Plan Comments:         Anesthesia Quick Evaluation

## 2018-02-12 NOTE — Anesthesia Postprocedure Evaluation (Signed)
Anesthesia Post Note  Patient: Tanya Richards  Procedure(s) Performed: CESAREAN SECTION, ovarian cystectomy, possible RSO, possibo RO (Right ) ovarian cystectomy, possible RSO, possibo RO (Right )     Patient location during evaluation: PACU Anesthesia Type: Spinal Level of consciousness: awake and alert and oriented Pain management: pain level controlled Vital Signs Assessment: post-procedure vital signs reviewed and stable Respiratory status: nonlabored ventilation, spontaneous breathing and respiratory function stable Cardiovascular status: stable and blood pressure returned to baseline Postop Assessment: no headache, no backache, spinal receding, patient able to bend at knees and no apparent nausea or vomiting Anesthetic complications: no    Last Vitals:  Vitals:   02/12/18 1645 02/12/18 1817  BP: 109/63 (!) 101/56  Pulse: (!) 52 (!) 54  Resp: 20 18  Temp: 36.5 C 36.7 C  SpO2:      Last Pain:  Vitals:   02/12/18 1817  TempSrc: Oral  PainSc: 0-No pain                 Devanta Daniel A.

## 2018-02-12 NOTE — Brief Op Note (Signed)
02/12/2018  2:08 PM  PATIENT:  Tanya Richards  32 y.o. female  PRE-OPERATIVE DIAGNOSIS:  right ovarian cyst  POST-OPERATIVE DIAGNOSIS:  primary c-section; right ovarian cysectomy  PROCEDURE:  Procedure(s) with comments: CESAREAN SECTION, ovarian cystectomy, possible RSO, possibo RO (Right) - primary edc 02/16/18 allergy to augmentin and benadryl Tracey RNFA ovarian cystectomy, possible RSO, possibo RO (Right)  SURGEON:  Surgeon(s) and Role: Panel 1:    * Charm Stenner, Colin Benton, MD - Primary Panel 2:    * Tyson Dense, MD - Primary  PHYSICIAN ASSISTANT:   ASSISTANTS: Glendon Axe   ANESTHESIA:   spinal  EBL:  400 mL   BLOOD ADMINISTERED:none  DRAINS: Urinary Catheter (Foley)   LOCAL MEDICATIONS USED:  NONE  SPECIMEN:  Source of Specimen:  placenta, R ovarian cyst fluid, R ovarian cyst  DISPOSITION OF SPECIMEN:  PATHOLOGY  COUNTS:  YES  TOURNIQUET:  * No tourniquets in log *  DICTATION: .Note written in EPIC  PLAN OF CARE: Admit to inpatient   PATIENT DISPOSITION:  PACU - hemodynamically stable.   Delay start of Pharmacological VTE agent (>24hrs) due to surgical blood loss or risk of bleeding: not applicable

## 2018-02-12 NOTE — Transfer of Care (Signed)
Immediate Anesthesia Transfer of Care Note  Patient: Tanya Richards  Procedure(s) Performed: CESAREAN SECTION, ovarian cystectomy, possible RSO, possibo RO (Right ) ovarian cystectomy, possible RSO, possibo RO (Right )  Patient Location: PACU  Anesthesia Type:Spinal  Level of Consciousness: awake  Airway & Oxygen Therapy: Patient Spontanous Breathing  Post-op Assessment: Report given to RN  Post vital signs: Reviewed and stable  Last Vitals:  Vitals Value Taken Time  BP    Temp    Pulse    Resp    SpO2      Last Pain:  Vitals:   02/12/18 1134  TempSrc: Oral         Complications: No apparent anesthesia complications

## 2018-02-12 NOTE — Op Note (Signed)
PROCEDURE DATE: 02/02/18  PREOPERATIVE DIAGNOSIS: Intrauterine pregnancy at 39.3 wga, Indication: desires ovarian cystectomy with primary CS  POSTOPERATIVE DIAGNOSIS:The same  PROCEDURE: primary Burna Section, right ovarian cystectomy  SURGEON: Dr. Lucillie Garfinkel  INDICATIONS:This is a 32yo G2P1001 at 39.3 wga requiring cesarean section secondary to maternal request. She had a h/o a likely dermoid in R ovary that was found on [place] causing intermittent pain and she desired removed. Option given to remove postpartum and patient opted for CS w/dermoid removal. The risks of cesarean section and ovarian cystectomy discussed with the patient included but were not limited to: bleeding which may require transfusion or reoperation; infection which may require antibiotics; injury to bowel, bladder, ureters or other surrounding organs; injury to the fetus; need for additional procedures including hysterectomy in the event of a life-threatening hemorrhage; placental abnormalities wth subsequent pregnancies, incisional problems, thromboembolic phenomenon and other postoperative/anesthesia complications. The patient agreed with the proposed plan, giving informed consent for the procedure.   FINDINGS: Viable femaleinfant in vertex presentation,APGARs pending, Weight pending, Amniotic fluid clear, Intact placenta, three vessel cord. Grossly normal uterus and fallopian tubes. Normal left ovary. Right ovary with ~6cm cyst .  ANESTHESIA: Epidural ESTIMATED BLOOD LOSS: 400cc SPECIMENS: Placenta for L&D, Right ovarian cyst with cyst fluid COMPLICATIONS: None immediate  PROCEDURE IN DETAIL: The patient received intravenous antibiotics and had sequential compression devices applied to her lower extremities while in the preoperative area. Shewasthen taken to the operating roomwhere epidural anesthesiawas dosed up to surgical level andwas found to be adequate. She was then  placed in a dorsal supine position with a leftward tilt,and prepped and draped in a sterile manner.A foley catheter was placed into her bladder and attached to constant gravity. After an adequate timeout was performed, aPfannenstiel skin incision was made with scalpel and carried through to the underlying layer of fascia. The fascia was incised in the midline and this incision was extended bilaterally using the Mayo scissors. Kocher clamps were applied to the superior aspect of the fascial incision and the underlying rectus muscles were dissected off bluntly. A similar process was carried out on the inferior aspect of the facial incision. The rectus muscles were separated in the midline bluntly and the peritoneum was entered bluntly. A bladder flap was created sharply and developed bluntly.Atransverse hysterotomy was made with a scalpel and extended bilaterally bluntly. The bladder blade was then removed. The infant was successfully delivered, and cord was clamped and cut and infant was handed over to awaiting neonatology team. Uterine massage was then administered and the placenta delivered intact with three-vessel cord. Cord gases were taken. The uterus was cleared of clot and debris. The hysterotomy was closed with 0 vicryl.A second imbricating suture of 0-vicryl was used to reinforce the incision and aid in hemostasis. Attention then turned to right ovary. A need was used to aspirate ~6cc of cyst fluid and it was sent to pathology. An incision was gently made with a bovie over presumed area of cyst. Hemostats were used to grasp cyst edges and gently peel the cyst away from the normal ovarian tissue. The entire cyst was removed intact in this fashion. No intraoperative spillage. Of note, the ovary cyst was cut into once in specimen cup in order to be evaluate for potentially sending to frozen. It appeared to have benign dermoid contents and it will be sent for routine pathology. The ovarian tissue was  noted to be hemostatic. The edges were gently brought together with a running 4'0 vicryl  stitch for re-approximation of the tissue. Good hemostasis was noted.  The fascia was closed with 0-Vicryl in a running fashion with good restoration of anatomy. The subcutaneus tissue was irrigated and was reapproximated using three interrupted plain gut stitches. The skin was closed with 4-0 Vicryl in a subcuticular fashion.  Final EBL was 400cc (all surgical site and was hemostatic at end of procedure) without any further bleeding on exam.   Pt tolerated the procedure well. All sponge/lap/needle counts were correct X 2. Pt taken to recovery room in stable condition.   It's a girl - "Tanya Richards" to join sister, Tanya Richards at home!!   Lucillie Garfinkel MD

## 2018-02-13 LAB — CBC
HCT: 29.3 % — ABNORMAL LOW (ref 36.0–46.0)
Hemoglobin: 9.8 g/dL — ABNORMAL LOW (ref 12.0–15.0)
MCH: 30.6 pg (ref 26.0–34.0)
MCHC: 33.4 g/dL (ref 30.0–36.0)
MCV: 91.6 fL (ref 78.0–100.0)
Platelets: 208 10*3/uL (ref 150–400)
RBC: 3.2 MIL/uL — ABNORMAL LOW (ref 3.87–5.11)
RDW: 13.6 % (ref 11.5–15.5)
WBC: 10.8 10*3/uL — ABNORMAL HIGH (ref 4.0–10.5)

## 2018-02-13 LAB — BIRTH TISSUE RECOVERY COLLECTION (PLACENTA DONATION)

## 2018-02-13 NOTE — Progress Notes (Signed)
Subjective: Postpartum Day 1: Cesarean Delivery Patient reports tolerating PO and no problems voiding.    Objective: Vital signs in last 24 hours: Temp:  [97.5 F (36.4 C)-99.5 F (37.5 C)] 98.2 F (36.8 C) (05/01 0200) Pulse Rate:  [50-78] 55 (05/01 0615) Resp:  [13-24] 20 (05/01 0615) BP: (98-134)/(53-94) 98/55 (05/01 0615) SpO2:  [95 %-100 %] 98 % (05/01 0615) Weight:  [217 lb (98.4 kg)] 217 lb (98.4 kg) (04/30 1145)  Physical Exam:  General: alert, cooperative and appears stated age Lochia: appropriate Uterine Fundus: firm Incision: healing well, no significant drainage, no dehiscence DVT Evaluation: No evidence of DVT seen on physical exam. Negative Homan's sign. No cords or calf tenderness.  Recent Labs    02/11/18 0956 02/13/18 0612  HGB 12.8 9.8*  HCT 37.7 29.3*    Assessment/Plan: Status post Cesarean section. Doing well postoperatively.  Continue current care.  Tanya Richards, Emma 02/13/2018, 9:14 AM

## 2018-02-13 NOTE — Addendum Note (Signed)
Addendum  created 02/13/18 0810 by Hewitt Blade, CRNA   Sign clinical note

## 2018-02-13 NOTE — Lactation Note (Signed)
This note was copied from a baby's chart. Lactation Consultation Note Baby 68 hrs old. Not interested in BF at this time. Mom stated baby has been spitting up.  Mom BF her now 32 yr old for 13 months.  Has short shaft nipples "V" shaped breast. Lt. Breast slightly larger than Rt. Mom has good colostrum easily expressed. Encouraged to assess for transfer after feeding. Shells and hand pump given to evert nipples more. Mom encouraged to feed baby 8-12 times/24 hours and with feeding cues. Reviewed newborn behaviors, feeding habits, STS, I&O, supply and demand. Discussed positioning, support and comfort during feeding. Assisted in football hold. Baby wouldn't open mouth. Sleepy.  Encouraged to call for assistance or questions. [place] brochure given w/resources, support groups and [place] services.  Patient Name: Tanya Richards [id number]'E Date: 02/13/2018 Reason for consult: Initial assessment   Maternal Data Has patient been taught Hand Expression?: Yes Does the patient have breastfeeding experience prior to this delivery?: Yes  Feeding    LATCH Score Latch: Too sleepy or reluctant, no latch achieved, no sucking elicited.  Audible Swallowing: None  Type of Nipple: Everted at rest and after stimulation  Comfort (Breast/Nipple): Soft / non-tender  Hold (Positioning): Assistance needed to correctly position infant at breast and maintain latch.  LATCH Score: 5  Interventions Interventions: Breast feeding basics reviewed;Support pillows;Assisted with latch;Position options;Skin to skin;Breast massage;Expressed milk;Hand express;Shells;Pre-pump if needed;Hand pump;Breast compression;Adjust position  Lactation Tools Discussed/Used Tools: Shells;Pump Shell Type: Inverted Breast pump type: Manual WIC Program: No Pump Review: Setup, frequency, and cleaning;Milk Storage Initiated by:: Tanya Stack RN IBCLC Date initiated:: 02/13/18   Consult Status Consult Status: Follow-up Date:  02/13/18 Follow-up type: In-patient    Theodoro Kalata 02/13/2018, 5:55 AM

## 2018-02-13 NOTE — Anesthesia Postprocedure Evaluation (Signed)
Anesthesia Post Note  Patient: Tanya Richards  Procedure(s) Performed: CESAREAN SECTION, ovarian cystectomy, possible RSO, possibo RO (Right ) ovarian cystectomy, possible RSO, possibo RO (Right )     Patient location during evaluation: Mother Baby Anesthesia Type: Combined Spinal/Epidural Level of consciousness: awake and alert and oriented Pain management: pain level controlled Vital Signs Assessment: post-procedure vital signs reviewed and stable Respiratory status: spontaneous breathing, nonlabored ventilation and respiratory function stable Cardiovascular status: stable Postop Assessment: no headache, no backache, spinal receding, no apparent nausea or vomiting, patient able to bend at knees and adequate PO intake Anesthetic complications: no    Last Vitals:  Vitals:   02/13/18 0200 02/13/18 0615  BP: (!) 106/55 (!) 98/55  Pulse: (!) 56 (!) 55  Resp: 18 20  Temp: 36.8 C   SpO2:  98%    Last Pain:  Vitals:   02/13/18 0610  TempSrc:   PainSc: 5    Pain Goal:                 Jabier Mutton

## 2018-02-14 MED ORDER — TETANUS-DIPHTH-ACELL PERTUSSIS 5-2.5-18.5 LF-MCG/0.5 IM SUSP: 0.5000 mL | Freq: Once | INTRAMUSCULAR | 0 refills | Status: AC

## 2018-02-14 MED ORDER — OXYCODONE HCL 5 MG PO TABS: 5.0000 mg | ORAL_TABLET | ORAL | 0 refills | Status: DC | PRN

## 2018-02-14 MED ORDER — IBUPROFEN 600 MG PO TABS: 600.0000 mg | ORAL_TABLET | Freq: Four times a day (QID) | ORAL | 0 refills | Status: DC

## 2018-02-14 NOTE — Lactation Note (Signed)
This note was copied from a baby's chart. Lactation Consultation Note: Experienced BF mom reports baby has been nursing well. Last fed about 2 hours ago and is asleep in moms arms. Reports breasts are feeling fuller today and she hears more swallows while nursing. When hand expressing milk looks more whitish. No questions at present. Reviewed our phone number, OP appointments and BFSG as resources for support after DC. To call prn  Patient Name: Tanya Richards [id number] Date: 02/14/2018 Reason for consult: Follow-up assessment   Maternal Data Formula Feeding for Exclusion: No Has patient been taught Hand Expression?: Yes Does the patient have breastfeeding experience prior to this delivery?: Yes  Feeding    LATCH Score                   Interventions    Lactation Tools Discussed/Used WIC Program: No   Consult Status Consult Status: Complete    Truddie Crumble 02/14/2018, 10:26 AM

## 2018-02-14 NOTE — Plan of Care (Signed)
Current medication is managing patient's pain.

## 2018-02-14 NOTE — Discharge Summary (Signed)
Obstetric Discharge Summary Reason for Admission: cesarean section Prenatal Procedures: none Intrapartum Procedures: cesarean: low cervical, transverse and ovarian cystectomy Postpartum Procedures: none Complications-Operative and Postpartum: none Hemoglobin  Date Value Ref Range Status  02/13/2018 9.8 (L) 12.0 - 15.0 g/dL Final    Comment:    DELTA CHECK NOTED REPEATED TO VERIFY    HCT  Date Value Ref Range Status  02/13/2018 29.3 (L) 36.0 - 46.0 % Final    Physical Exam:  General: alert, cooperative, appears stated age and no distress Lochia: appropriate Uterine Fundus: firm Incision: healing well DVT Evaluation: No evidence of DVT seen on physical exam.  Discharge Diagnoses: Term Pregnancy-delivered  Discharge Information: Date: 02/14/2018 Activity: pelvic rest Diet: routine Medications: Ibuprofen and Percocet Condition: stable Instructions: refer to practice specific booklet Discharge to: home   Newborn Data: Live born female  Birth Weight: 6 lb 8.4 oz (2960 g) APGAR: 9, 9  Newborn Delivery   Birth date/time:  02[birth year]/2018 13:20:00 Delivery type:  C-Section, Low Transverse Trial of labor:  No C-section categorization:  Primary     Home with mother.  Doshie Maggi C 02/14/2018, 9:36 AM

## 2018-03-27 DIAGNOSIS — E039 Hypothyroidism, unspecified: Secondary | ICD-10-CM | POA: Diagnosis not present

## 2018-04-01 DIAGNOSIS — E039 Hypothyroidism, unspecified: Secondary | ICD-10-CM | POA: Diagnosis not present

## 2018-05-30 NOTE — Telephone Encounter (Signed)
error 

## 2018-07-10 DIAGNOSIS — Z23 Encounter for immunization: Secondary | ICD-10-CM | POA: Diagnosis not present

## 2018-07-15 DIAGNOSIS — E039 Hypothyroidism, unspecified: Secondary | ICD-10-CM | POA: Diagnosis not present

## 2018-08-01 DIAGNOSIS — E039 Hypothyroidism, unspecified: Secondary | ICD-10-CM | POA: Diagnosis not present

## 2018-08-01 DIAGNOSIS — R5383 Other fatigue: Secondary | ICD-10-CM | POA: Diagnosis not present

## 2018-08-01 DIAGNOSIS — G4710 Hypersomnia, unspecified: Secondary | ICD-10-CM | POA: Diagnosis not present

## 2018-12-18 ENCOUNTER — Ambulatory Visit: Payer: Self-pay | Admitting: Family Medicine

## 2018-12-18 ENCOUNTER — Encounter: Payer: Self-pay | Admitting: Family Medicine

## 2018-12-18 VITALS — BP 112/72 | HR 71 | Temp 98.1°F | Resp 14 | Wt 190.8 lb

## 2018-12-18 DIAGNOSIS — H6983 Other specified disorders of Eustachian tube, bilateral: Secondary | ICD-10-CM

## 2018-12-18 DIAGNOSIS — J069 Acute upper respiratory infection, unspecified: Secondary | ICD-10-CM

## 2018-12-18 DIAGNOSIS — J309 Allergic rhinitis, unspecified: Secondary | ICD-10-CM

## 2018-12-18 MED ORDER — AZELASTINE HCL 0.1 % NA SOLN: 1.0000 | Freq: Two times a day (BID) | NASAL | 0 refills | Status: DC

## 2018-12-18 NOTE — Progress Notes (Signed)
Tanya Richards is a 33 y.o. female who presents today with 10 days of cough congestion symptoms, patient has attempted treatments of mucinex and zyrtec and this has improved her symptoms. She is concerned because she reports using the medications and while experiencing some relief she still has persistent symptoms although improved, a cough, and now newer onset sore throat and ear pain. She denies any sick contacts at home she is a mother of 35 a 71 year old who is well and in daycare and a 51 month old who she is breastfeeding who she reports is well. For her symptoms Tanya Richards utilized supportive care OTC cough and allergy medication  intermittently and she also reports the use of a neti pot, she reports a history of seasonal allergies but denies using medication consistently for this concern prior to illness. She denies any fever and does report an episode of a tonsil stone that she removed without difficulty.   Review of Systems  Constitutional: Negative for chills, fever and malaise/fatigue.  HENT: Positive for congestion, ear pain and sore throat. Negative for ear discharge and sinus pain.   Eyes: Negative.   Respiratory: Positive for cough. Negative for sputum production and shortness of breath.   Cardiovascular: Negative.  Negative for chest pain.  Gastrointestinal: Negative for abdominal pain, diarrhea, nausea and vomiting.  Genitourinary: Negative for dysuria, frequency, hematuria and urgency.  Musculoskeletal: Negative for myalgias.  Skin: Negative.   Neurological: Negative for headaches.  Endo/Heme/Allergies: Negative.   Psychiatric/Behavioral: Negative.     Vira has a current medication list which includes the following prescription(s): levothyroxine, prenatal multivit-min-fe-fa, azelastine, cetirizine, fluticasone, ibuprofen, ferrous sulfate, and oxycodone. Also is allergic to augmentin [amoxicillin-pot clavulanate] and benadryl [diphenhydramine].  Tanya Richards  has a past medical  history of Anxiety, Dermoid cyst, Hypothyroidism, Immunization, BCG, Infertility, female, and Seizures ([place]). Also  has a past surgical history that includes Wisdom tooth extraction and Cesarean section (Right, 02/12/2018).    O: Vitals:   12/18/18 1012  BP: 112/72  Pulse: 71  Resp: 14  Temp: 98.1 F (36.7 C)  SpO2: 98%     Physical Exam Vitals signs reviewed.  Constitutional:      Appearance: She is well-developed. She is not toxic-appearing.  HENT:     Head: Normocephalic.     Right Ear: Hearing, tympanic membrane, ear canal and external ear normal.     Left Ear: Hearing, tympanic membrane, ear canal and external ear normal.     Nose: Nose normal.     Mouth/Throat:     Pharynx: Uvula midline. Posterior oropharyngeal erythema present. No pharyngeal swelling, oropharyngeal exudate or uvula swelling.     Tonsils: Tonsillar exudate present. No tonsillar abscesses. Swelling: 2+ on the right. 2+ on the left.  Neck:     Musculoskeletal: Normal range of motion and neck supple.  Cardiovascular:     Rate and Rhythm: Normal rate and regular rhythm.     Pulses: Normal pulses.     Heart sounds: Normal heart sounds.  Pulmonary:     Effort: Pulmonary effort is normal.     Breath sounds: Normal breath sounds.  Abdominal:     General: Bowel sounds are normal.     Palpations: Abdomen is soft.  Musculoskeletal: Normal range of motion.  Lymphadenopathy:     Head:     Right side of head: No submental, submandibular or tonsillar adenopathy.     Left side of head: No submental, submandibular or tonsillar adenopathy.     Cervical:  Cervical adenopathy present.     Right cervical: Superficial cervical adenopathy present.     Left cervical: Superficial cervical adenopathy present.  Neurological:     Mental Status: She is alert and oriented to person, place, and time.    A: 1. Allergic rhinitis, unspecified seasonality, unspecified trigger   2. Upper respiratory tract infection, unspecified  type   3. Dysfunction of both eustachian tubes    P:  1. Allergic rhinitis, unspecified seasonality, unspecified trigger Discussed astelin but patient lactating so discontinued and advised to continue to treat with some consistency for URI and seasonal allergies.  Meds ordered this encounter  Medications  . DISCONTD: azelastine (ASTELIN) 0.1 % nasal spray    Sig: Place 1 spray into both nostrils 2 (two) times daily. Use in each nostril as directed    Dispense:  30 mL    Refill:  0    Order Specific Question:   Supervising Provider    Answer:   MILLER, [place]     2. Upper respiratory tract infection, unspecified type Suspect patient is reaching the tail end of these symptoms as they are not worsening and did respond to OTC symptom management- suspect patient may have had better response of even recovered if medication had been used with some consistency.   3. Dysfunction of both eustachian tubes Discussed the frequency of sniffling and untreated sinus congestion that may be causing the ear pain that today was dx as eustachian tube dysfunction. Reviewed with patient non pharmacologic treatment options to reduce symptoms since medication selection is limited due to lactation   Discussed with patient exam findings, suspected diagnosis etiology and  reviewed recommended treatment plan and follow up, including complications and indications for urgent medical follow up and evaluation. Medications including use and indications reviewed with patient. Patient provided relevant patient education on diagnosis and/or relevant related condition that were discussed and reviewed with patient at discharge. Patient verbalized understanding of information provided and agrees with plan of care (POC), all questions answered.

## 2018-12-18 NOTE — Patient Instructions (Addendum)
Allergic Rhinitis, Adult Astelin (Lactation) Flonase Mucinex- DM Increase oral hydration  Allergic rhinitis is an allergic reaction that affects the mucous membrane inside the nose. It causes sneezing, a runny or stuffy nose, and the feeling of mucus going down the back of the throat (postnasal drip). Allergic rhinitis can be mild to severe. There are two types of allergic rhinitis:  Seasonal. This type is also called hay fever. It happens only during certain seasons.  Perennial. This type can happen at any time of the year. What are the causes? This condition happens when the body's defense system (immune system) responds to certain harmless substances called allergens as though they were germs.  Seasonal allergic rhinitis is triggered by pollen, which can come from grasses, trees, and weeds. Perennial allergic rhinitis may be caused by:  House dust mites.  Pet dander.  Mold spores. What are the signs or symptoms? Symptoms of this condition include:  Sneezing.  Runny or stuffy nose (nasal congestion).  Postnasal drip.  Itchy nose.  Tearing of the eyes.  Trouble sleeping.  Daytime sleepiness. How is this diagnosed? This condition may be diagnosed based on:  Your medical history.  A physical exam.  Tests to check for related conditions, such as: ? Asthma. ? Pink eye. ? Ear infection. ? Upper respiratory infection.  Tests to find out which allergens trigger your symptoms. These may include skin or blood tests. How is this treated? There is no cure for this condition, but treatment can help control symptoms. Treatment may include:  Taking medicines that block allergy symptoms, such as antihistamines. Medicine may be given as a shot, nasal spray, or pill.  Avoiding the allergen.  Desensitization. This treatment involves getting ongoing shots until your body becomes less sensitive to the allergen. This treatment may be done if other treatments do not help.  If  taking medicine and avoiding the allergen does not work, new, stronger medicines may be prescribed. Follow these instructions at home:  Find out what you are allergic to. Common allergens include smoke, dust, and pollen.  Avoid the things you are allergic to. These are some things you can do to help avoid allergens: ? Replace carpet with wood, tile, or vinyl flooring. Carpet can trap dander and dust. ? Do not smoke. Do not allow smoking in your home. ? Change your heating and air conditioning filter at least once a month. ? During allergy season:  Keep windows closed as much as possible.  Plan outdoor activities when pollen counts are lowest. This is usually during the evening hours.  When coming indoors, change clothing and shower before sitting on furniture or bedding.  Take over-the-counter and prescription medicines only as told by your health care provider.  Keep all follow-up visits as told by your health care provider. This is important. Contact a health care provider if:  You have a fever.  You develop a persistent cough.  You make whistling sounds when you breathe (you wheeze).  Your symptoms interfere with your normal daily activities. Get help right away if:  You have shortness of breath. Summary  This condition can be managed by taking medicines as directed and avoiding allergens.  Contact your health care provider if you develop a persistent cough or fever.  During allergy season, keep windows closed as much as possible. This information is not intended to replace advice given to you by your health care provider. Make sure you discuss any questions you have with your health care provider. Document Released:  06/27/2001 Document Revised: 11/09/2016 Document Reviewed: 11/09/2016 Elsevier Interactive Patient Education  [employer].

## 2018-12-20 ENCOUNTER — Telehealth: Payer: Self-pay

## 2018-12-20 NOTE — Telephone Encounter (Signed)
LM on pt vm tcb regarding how she is feeling since her visit with us. 

## 2018-12-22 ENCOUNTER — Ambulatory Visit: Payer: Self-pay | Admitting: Nurse Practitioner

## 2018-12-22 VITALS — BP 105/60 | HR 72 | Temp 98.4°F | Resp 17 | Wt 196.0 lb

## 2018-12-22 DIAGNOSIS — B9689 Other specified bacterial agents as the cause of diseases classified elsewhere: Secondary | ICD-10-CM

## 2018-12-22 DIAGNOSIS — H6983 Other specified disorders of Eustachian tube, bilateral: Secondary | ICD-10-CM

## 2018-12-22 DIAGNOSIS — J019 Acute sinusitis, unspecified: Secondary | ICD-10-CM

## 2018-12-22 MED ORDER — CEFDINIR 300 MG PO CAPS: 300.0000 mg | ORAL_CAPSULE | Freq: Two times a day (BID) | ORAL | 0 refills | Status: AC

## 2018-12-22 MED ORDER — PREDNISONE 20 MG PO TABS: 40.0000 mg | ORAL_TABLET | Freq: Every day | ORAL | 0 refills | Status: AC

## 2018-12-22 NOTE — Progress Notes (Signed)
MRN: [id number] DOB: [birth year]  Subjective:   Tanya Richards is a 33 y.o. female presenting for chief complaint of Ear Pain (pain when swallow); Sore Throat (1 WEEK); Generalized Body Aches (1 WEEK); and Nasal Congestion (2 DAYS) .  Reports an almost 2 week history of sinus headache, sinus congestion , ear fullness, sore throat, pain with swallowing and productive cough, fatigue and decreased appetite, fatigue. Has tried Zyrtec, Claritin, Flonase and Mucinex for  relief. Patient has been attempting symptomatic treatment over the past 4 days with no relief.  Patient states, "I feel worse".  Patient informs her ear fullness/pressure is much worse and she feel like her hearing is "muffled". States discomfort from ear pressure/fullness ranges from 5-8/10.  Patient also informs her cough is worsening to the point she is unable to sleep. The patient is 10 months postpartum and is currently breastfeeding.  Patient informs her baby is also formula fed and switch the child to formula as she states, "I am miserable".  Denies itchy watery eyes, red eyes, wheezing, shortness of breath, chest tightness and chest pain, nausea, vomiting, abdominal pain and diarrhea. The patient admits to a history of seasonal allergies, denies history of asthma. Patient has had a  flu shot this season. Denies smoking. Denies any other aggravating or relieving factors, no other questions or concerns.  Review of Systems  Constitutional: Positive for malaise/fatigue.  HENT: Positive for congestion (worsening) and sore throat. Negative for sinus pain.        Bilateral ear fullness/pressure  Eyes: Negative.   Respiratory: Positive for cough and sputum production. Negative for hemoptysis, shortness of breath and wheezing.   Cardiovascular: Negative.   Skin: Negative.   Neurological: Positive for headaches. Negative for dizziness, tingling, tremors and sensory change.  Endo/Heme/Allergies: Positive for environmental allergies.     Tanya Richards has a current medication list which includes the following prescription(s): fluticasone, levothyroxine, cetirizine, fluticasone, ibuprofen, ferrous sulfate, oxycodone, and prenatal multivit-min-fe-fa. Also is allergic to augmentin [amoxicillin-pot clavulanate] and benadryl [diphenhydramine].  [name]  has a past medical history of Anxiety, Dermoid cyst, Hypothyroidism, Immunization, BCG, Infertility, female, and Seizures ([place]). Also  has a past surgical history that includes Wisdom tooth extraction and Cesarean section (Right, 02/12/2018).   Objective:   Vitals: BP 105/60 (BP Location: Right Arm, Patient Position: Sitting, Cuff Size: Normal)   Pulse 72   Temp 98.4 F (36.9 C) (Oral)   Resp 17   Wt 196 lb (88.9 kg)   SpO2 98%   Breastfeeding Yes   BMI 31.64 kg/m   Physical Exam Vitals signs reviewed.  Constitutional:      General: She is not in acute distress. HENT:     Head: Normocephalic.     Right Ear: Ear canal and external ear normal. A middle ear effusion is present. Tympanic membrane is not erythematous.     Left Ear: Ear canal and external ear normal. A middle ear effusion is present. Tympanic membrane is not erythematous.     Nose: Mucosal edema, congestion (severe) and rhinorrhea (clear nasal drainage) present.     Right Turbinates: Enlarged and swollen.     Left Turbinates: Enlarged and swollen.     Right Sinus: No maxillary sinus tenderness or frontal sinus tenderness.     Left Sinus: No maxillary sinus tenderness or frontal sinus tenderness.     Mouth/Throat:     Lips: Pink.     Mouth: Mucous membranes are moist.     Pharynx: Oropharynx is clear. Uvula midline.  Posterior oropharyngeal erythema present. No oropharyngeal exudate or uvula swelling.     Tonsils: No tonsillar exudate. Swelling: 1+ on the right. 0 on the left.  Neck:     Musculoskeletal: Normal range of motion and neck supple.  Cardiovascular:     Rate and Rhythm: Normal rate and regular  rhythm.     Pulses: Normal pulses.     Heart sounds: Normal heart sounds.  Pulmonary:     Effort: Pulmonary effort is normal. No respiratory distress.     Breath sounds: Normal breath sounds. No stridor. No wheezing, rhonchi or rales.  Abdominal:     General: Bowel sounds are normal.     Palpations: Abdomen is soft.     Tenderness: There is no abdominal tenderness.  Lymphadenopathy:     Cervical: No cervical adenopathy.  Skin:    General: Skin is warm and dry.     Findings: No rash.  Neurological:     General: No focal deficit present.     Mental Status: She is alert and oriented to person, place, and time.     Cranial Nerves: No cranial nerve deficit.  Psychiatric:        Mood and Affect: Mood normal.        Thought Content: Thought content normal.      Assessment and Plan :   Exam findings, diagnosis etiology and medication use and indications reviewed with patient. Follow- Up and discharge instructions provided. No emergent/urgent issues found on exam.  Based on the patient's worsening of symptoms, duration of symptoms, and physical exam, feel patient symptoms are most likely of bacterial etiology.  The patient does not display fever, but does have purulent nasal drainage and irresolution of symptoms with symptomatic treatment.  I am going to prescribe the patient an antibiotic for a likely bacterial sinus infection and steroids for her eustachian tube dysfunction.  The patient is currently breastfeeding so clear instructions were provided to the patient to formula feed her baby  while on these medications in addition to pumping and dumping for 48 hours post medication completion before resuming breastfeeding.  Patient was also instructed to not use the Flonase while completing the steroids, but to resume Flonase once steroids have been completed. Patient will use natural remedies for her cough. The patient is well-appearing, is in no acute distress and vitals signs are stable at this  time.  Patient education was provided. Patient verbalized understanding of information provided and agrees with plan of care (POC), all questions answered. The patient is advised to call or return to clinic if condition does not see an improvement in symptoms, or to seek the care of the closest emergency department if condition worsens with the above plan.   1. Acute bacterial sinusitis  - cefdinir (OMNICEF) 300 MG capsule; Take 1 capsule (300 mg total) by mouth 2 (two) times daily for 7 days.  Dispense: 14 capsule; Refill: 0 -Take medication as prescribed. -Ibuprofen or Tylenol for pain, fever, or general discomfort. -Increase fluids. -Sleep elevated on at least 2 pillows at bedtime to help with cough. -Use a humidifier or vaporizer when at home and during sleep. -May use a teaspoon of honey or over-the-counter cough drops to help with cough. -May use normal saline nasal spray to help with nasal congestion throughout the day. -Do not breastfeed while taking these medications.  Once you have completed these medications, pump and dump for 48 hours before resuming breastfeeding. -Follow up with your PCP if symptoms do  not improve.   2. Acute dysfunction of Eustachian tube, bilateral  - predniSONE (DELTASONE) 20 MG tablet; Take 2 tablets (40 mg total) by mouth daily with breakfast for 5 days.  Dispense: 10 tablet; Refill: 0 -Follow up with ENT if symptoms do not improve.

## 2018-12-22 NOTE — Patient Instructions (Signed)
Sinusitis, Adult -Take medication as prescribed. -Ibuprofen or Tylenol for pain, fever, or general discomfort. -Increase fluids. -Sleep elevated on at least 2 pillows at bedtime to help with cough. -Use a humidifier or vaporizer when at home and during sleep. -May use a teaspoon of honey or over-the-counter cough drops to help with cough. -May use normal saline nasal spray to help with nasal congestion throughout the day. -Do not breastfeed while taking these medications.  Once you have completed these medications, pump and dump for 48 hours before resuming breastfeeding. -Follow up with your PCP if symptoms do not improve.    Sinusitis is inflammation of your sinuses. Sinuses are hollow spaces in the bones around your face. Your sinuses are located:  Around your eyes.  In the middle of your forehead.  Behind your nose.  In your cheekbones. Mucus normally drains out of your sinuses. When your nasal tissues become inflamed or swollen, mucus can become trapped or blocked. This allows bacteria, viruses, and fungi to grow, which leads to infection. Most infections of the sinuses are caused by a virus. Sinusitis can develop quickly. It can last for up to 4 weeks (acute) or for more than 12 weeks (chronic). Sinusitis often develops after a cold. What are the causes? This condition is caused by anything that creates swelling in the sinuses or stops mucus from draining. This includes:  Allergies.  Asthma.  Infection from bacteria or viruses.  Deformities or blockages in your nose or sinuses.  Abnormal growths in the nose (nasal polyps).  Pollutants, such as chemicals or irritants in the air.  Infection from fungi (rare). What increases the risk? You are more likely to develop this condition if you:  Have a weak body defense system (immune system).  Do a lot of swimming or diving.  Overuse nasal sprays.  Smoke. What are the signs or symptoms? The main symptoms of this  condition are pain and a feeling of pressure around the affected sinuses. Other symptoms include:  Stuffy nose or congestion.  Thick drainage from your nose.  Swelling and warmth over the affected sinuses.  Headache.  Upper toothache.  A cough that may get worse at night.  Extra mucus that collects in the throat or the back of the nose (postnasal drip).  Decreased sense of smell and taste.  Fatigue.  A fever.  Sore throat.  Bad breath. How is this diagnosed? This condition is diagnosed based on:  Your symptoms.  Your medical history.  A physical exam.  Tests to find out if your condition is acute or chronic. This may include: ? Checking your nose for nasal polyps. ? Viewing your sinuses using a device that has a light (endoscope). ? Testing for allergies or bacteria. ? Imaging tests, such as an MRI or CT scan. In rare cases, a bone biopsy may be done to rule out more serious types of fungal sinus disease. How is this treated? Treatment for sinusitis depends on the cause and whether your condition is chronic or acute.  If caused by a virus, your symptoms should go away on their own within 10 days. You may be given medicines to relieve symptoms. They include: ? Medicines that shrink swollen nasal passages (topical intranasal decongestants). ? Medicines that treat allergies (antihistamines). ? A spray that eases inflammation of the nostrils (topical intranasal corticosteroids). ? Rinses that help get rid of thick mucus in your nose (nasal saline washes).  If caused by bacteria, your health care provider may recommend waiting  to see if your symptoms improve. Most bacterial infections will get better without antibiotic medicine. You may be given antibiotics if you have: ? A severe infection. ? A weak immune system.  If caused by narrow nasal passages or nasal polyps, you may need to have surgery. Follow these instructions at home: Medicines  Take, use, or apply  over-the-counter and prescription medicines only as told by your health care provider. These may include nasal sprays.  If you were prescribed an antibiotic medicine, take it as told by your health care provider. Do not stop taking the antibiotic even if you start to feel better. Hydrate and humidify   Drink enough fluid to keep your urine pale yellow. Staying hydrated will help to thin your mucus.  Use a cool mist humidifier to keep the humidity level in your home above 50%.  Inhale steam for 10-15 minutes, 3-4 times a day, or as told by your health care provider. You can do this in the bathroom while a hot shower is running.  Limit your exposure to cool or dry air. Rest  Rest as much as possible.  Sleep with your head raised (elevated).  Make sure you get enough sleep each night. General instructions   Apply a warm, moist washcloth to your face 3-4 times a day or as told by your health care provider. This will help with discomfort.  Wash your hands often with soap and water to reduce your exposure to germs. If soap and water are not available, use hand sanitizer.  Do not smoke. Avoid being around people who are smoking (secondhand smoke).  Keep all follow-up visits as told by your health care provider. This is important. Contact a health care provider if:  You have a fever.  Your symptoms get worse.  Your symptoms do not improve within 10 days. Get help right away if:  You have a severe headache.  You have persistent vomiting.  You have severe pain or swelling around your face or eyes.  You have vision problems.  You develop confusion.  Your neck is stiff.  You have trouble breathing. Summary  Sinusitis is soreness and inflammation of your sinuses. Sinuses are hollow spaces in the bones around your face.  This condition is caused by nasal tissues that become inflamed or swollen. The swelling traps or blocks the flow of mucus. This allows bacteria, viruses,  and fungi to grow, which leads to infection.  If you were prescribed an antibiotic medicine, take it as told by your health care provider. Do not stop taking the antibiotic even if you start to feel better.  Keep all follow-up visits as told by your health care provider. This is important. This information is not intended to replace advice given to you by your health care provider. Make sure you discuss any questions you have with your health care provider. Document Released: 10/02/2005 Document Revised: 03/04/2018 Document Reviewed: 03/04/2018 Elsevier Interactive Patient Education  2019 [place].  Eustachian Tube Dysfunction  Eustachian tube dysfunction refers to a condition in which a blockage develops in the narrow passage that connects the middle ear to the back of the nose (eustachian tube). The eustachian tube regulates air pressure in the middle ear by letting air move between the ear and nose. It also helps to drain fluid from the middle ear space. Eustachian tube dysfunction can affect one or both ears. When the eustachian tube does not function properly, air pressure, fluid, or both can build up in the middle  ear. What are the causes? This condition occurs when the eustachian tube becomes blocked or cannot open normally. Common causes of this condition include:  Ear infections.  Colds and other infections that affect the nose, mouth, and throat (upper respiratory tract).  Allergies.  Irritation from cigarette smoke.  Irritation from stomach acid coming up into the esophagus (gastroesophageal reflux). The esophagus is the tube that carries food from the mouth to the stomach.  Sudden changes in air pressure, such as from descending in an airplane or scuba diving.  Abnormal growths in the nose or throat, such as: ? Growths that line the nose (nasal polyps). ? Abnormal growth of cells (tumors). ? Enlarged tissue at the back of the throat (adenoids). What increases the  risk? You are more likely to develop this condition if:  You smoke.  You are overweight.  You are a child who has: ? Certain birth defects of the mouth, such as cleft palate. ? Large tonsils or adenoids. What are the signs or symptoms? Common symptoms of this condition include:  A feeling of fullness in the ear.  Ear pain.  Clicking or popping noises in the ear.  Ringing in the ear.  Hearing loss.  Loss of balance.  Dizziness. Symptoms may get worse when the air pressure around you changes, such as when you travel to an area of high elevation, fly on an airplane, or go scuba diving. How is this diagnosed? This condition may be diagnosed based on:  Your symptoms.  A physical exam of your ears, nose, and throat.  Tests, such as those that measure: ? The movement of your eardrum (tympanogram). ? Your hearing (audiometry). How is this treated? Treatment depends on the cause and severity of your condition.  In mild cases, you may relieve your symptoms by moving air into your ears. This is called "popping the ears."  In more severe cases, or if you have symptoms of fluid in your ears, treatment may include: ? Medicines to relieve congestion (decongestants). ? Medicines that treat allergies (antihistamines). ? Nasal sprays or ear drops that contain medicines that reduce swelling (steroids). ? A procedure to drain the fluid in your eardrum (myringotomy). In this procedure, a small tube is placed in the eardrum to:  Drain the fluid.  Restore the air in the middle ear space. ? A procedure to insert a balloon device through the nose to inflate the opening of the eustachian tube (balloon dilation). Follow these instructions at home: Lifestyle  Do not do any of the following until your health care provider approves: ? Travel to high altitudes. ? Fly in airplanes. ? Work in a Pension scheme manager or room. ? Scuba dive.  Do not use any products that contain nicotine or  tobacco, such as cigarettes and e-cigarettes. If you need help quitting, ask your health care provider.  Keep your ears dry. Wear fitted earplugs during showering and bathing. Dry your ears completely after. General instructions  Take over-the-counter and prescription medicines only as told by your health care provider.  Use techniques to help pop your ears as recommended by your health care provider. These may include: ? Chewing gum. ? Yawning. ? Frequent, forceful swallowing. ? Closing your mouth, holding your nose closed, and gently blowing as if you are trying to blow air out of your nose.  Keep all follow-up visits as told by your health care provider. This is important. Contact a health care provider if:  Your symptoms do not go away after  treatment.  Your symptoms come back after treatment.  You are unable to pop your ears.  You have: ? A fever. ? Pain in your ear. ? Pain in your head or neck. ? Fluid draining from your ear.  Your hearing suddenly changes.  You become very dizzy.  You lose your balance. Summary  Eustachian tube dysfunction refers to a condition in which a blockage develops in the eustachian tube.  It can be caused by ear infections, allergies, inhaled irritants, or abnormal growths in the nose or throat.  Symptoms include ear pain, hearing loss, or ringing in the ears.  Mild cases are treated with maneuvers to unblock the ears, such as yawning or ear popping.  Severe cases are treated with medicines. Surgery may also be done (rare). This information is not intended to replace advice given to you by your health care provider. Make sure you discuss any questions you have with your health care provider. Document Released: 10/29/2015 Document Revised: 01/22/2018 Document Reviewed: 01/22/2018 Elsevier Interactive Patient Education  2019 [employer].

## 2018-12-24 ENCOUNTER — Telehealth: Payer: Self-pay

## 2018-12-24 NOTE — Telephone Encounter (Signed)
Patient did not answered the phone 

## 2019-01-01 IMAGING — CR DG CHEST 2V
2 series · 2 of 2 positions shown · non-contrast
Comparison: None.

CLINICAL DATA: Worsening cough, shortness of breath, and wheezing
for several days.

EXAM:
CHEST  2 VIEW

[chest pa]
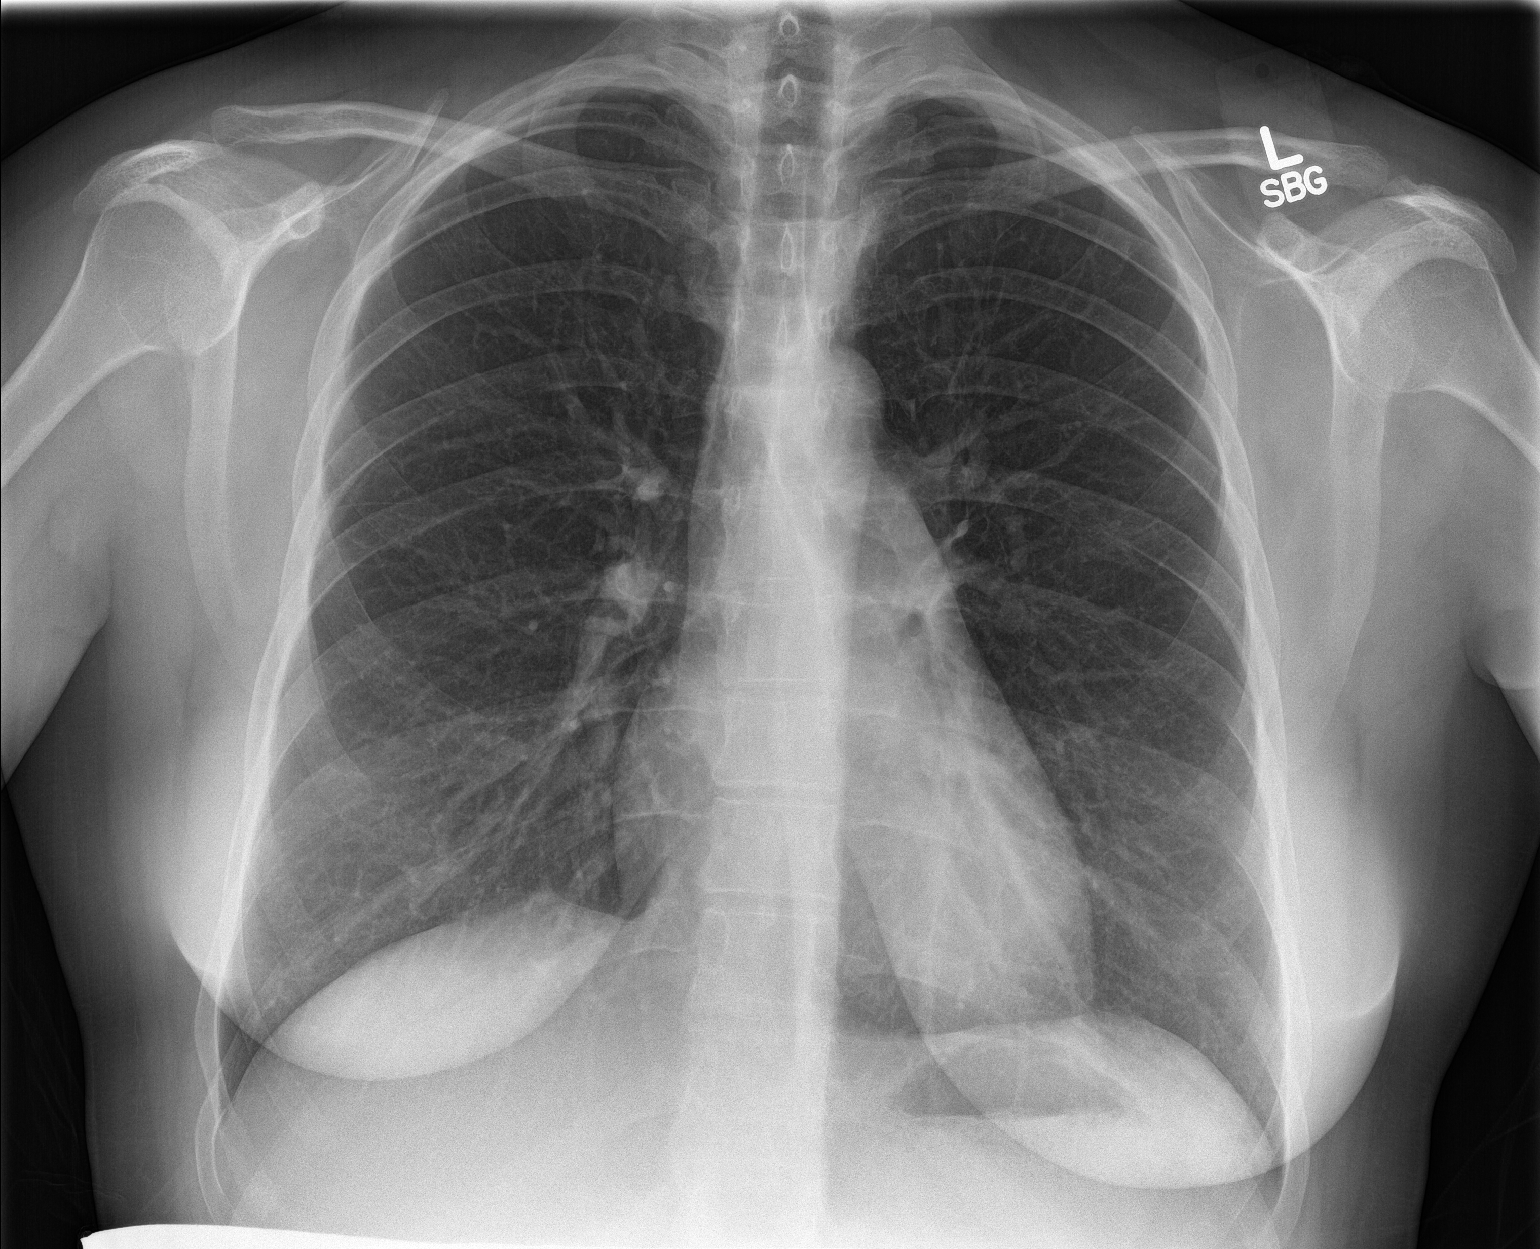

[chest lat]
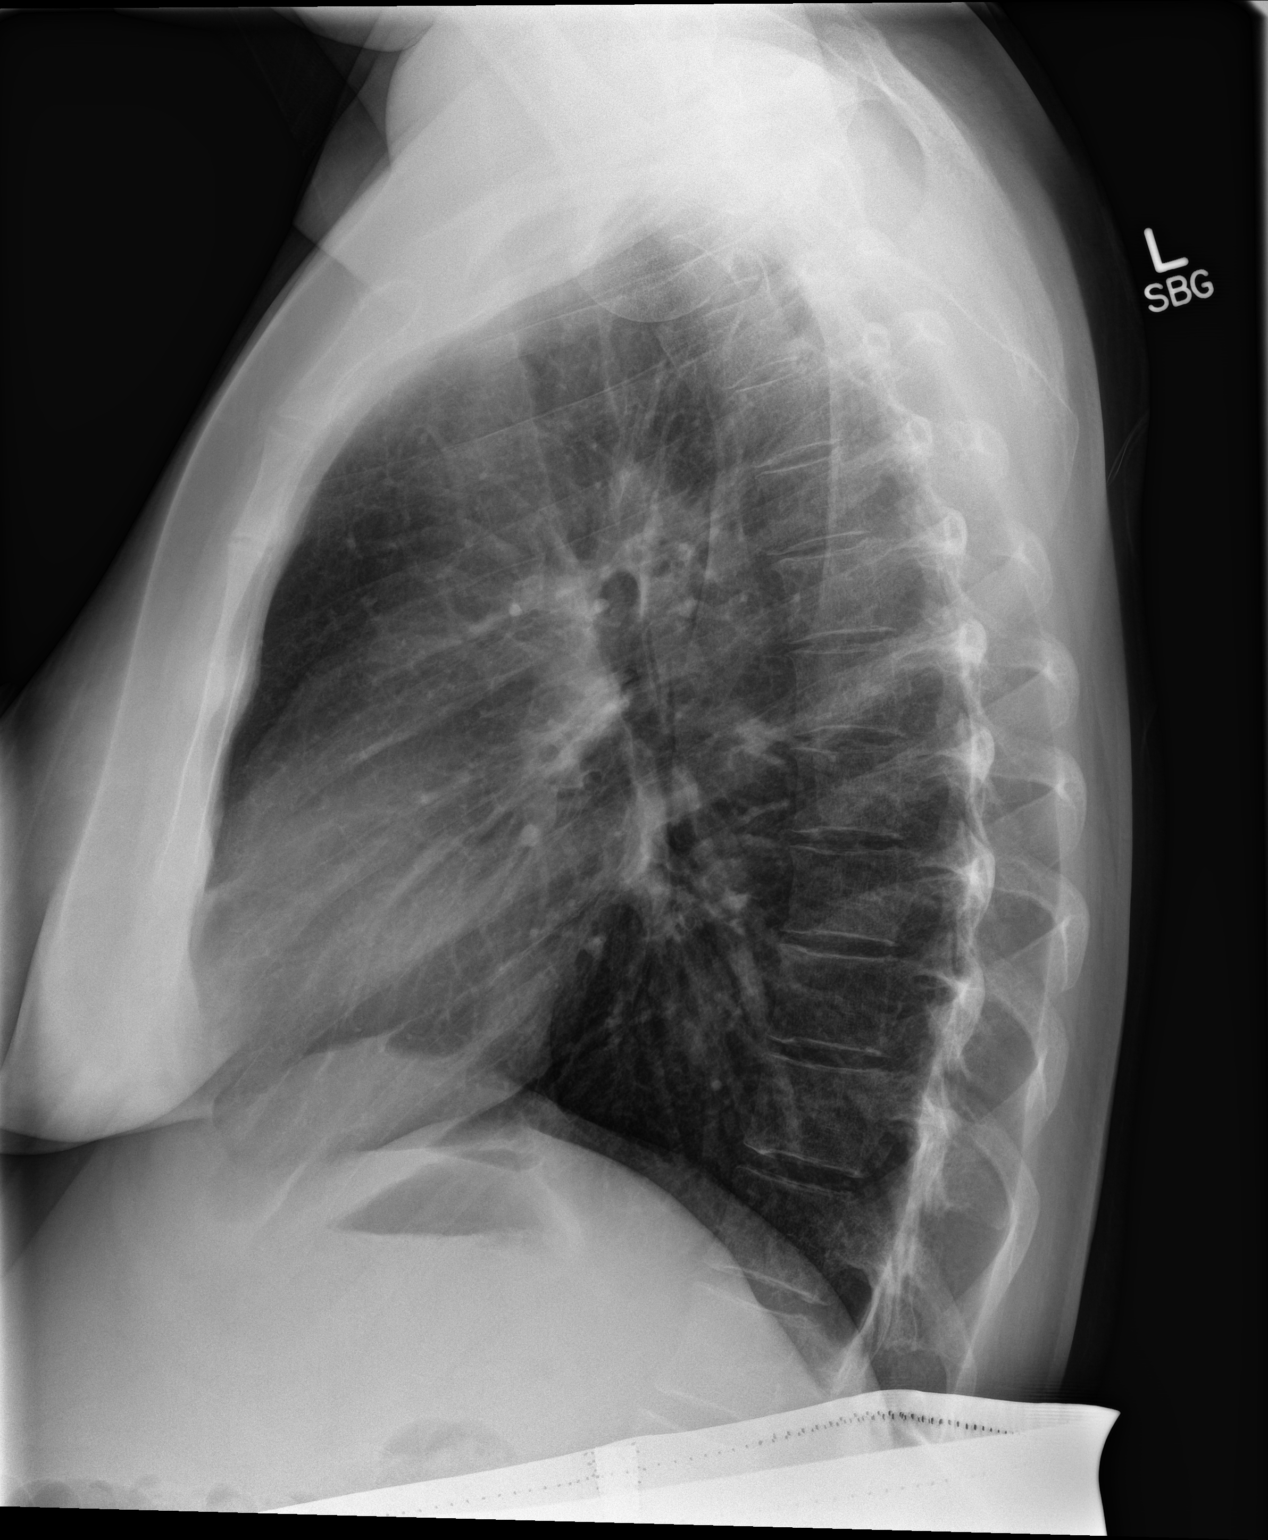

[2 of 2 positions shown; findings below may reference images not displayed]

FINDINGS: The heart size and mediastinal contours are within normal limits.
Both lungs are clear. The visualized skeletal structures are
unremarkable.
IMPRESSION: Negative.  No active cardiopulmonary disease.

## 2019-02-26 DIAGNOSIS — N390 Urinary tract infection, site not specified: Secondary | ICD-10-CM | POA: Diagnosis not present

## 2019-06-09 DIAGNOSIS — E079 Disorder of thyroid, unspecified: Secondary | ICD-10-CM | POA: Insufficient documentation

## 2019-06-10 DIAGNOSIS — F32A Depression, unspecified: Secondary | ICD-10-CM | POA: Insufficient documentation

## 2019-06-10 DIAGNOSIS — F329 Major depressive disorder, single episode, unspecified: Secondary | ICD-10-CM | POA: Insufficient documentation

## 2019-10-07 DIAGNOSIS — D369 Benign neoplasm, unspecified site: Secondary | ICD-10-CM | POA: Insufficient documentation

## 2019-12-08 ENCOUNTER — Ambulatory Visit: Payer: 59

## 2019-12-25 ENCOUNTER — Ambulatory Visit: Payer: 59 | Attending: Internal Medicine

## 2019-12-25 DIAGNOSIS — Z23 Encounter for immunization: Secondary | ICD-10-CM

## 2019-12-25 NOTE — Progress Notes (Signed)
   Covid-19 Vaccination Clinic  Name:  Tanya Richards    MRN: [id number] DOB: 01/06/1986  12/25/2019  [name] was observed post Covid-19 immunization for 15 minutes without incident. She was provided with Vaccine Information Sheet and instruction to access the V-Safe system.   [name] was instructed to call 911 with any severe reactions post vaccine: Marland Kitchen Difficulty breathing  . Swelling of face and throat  . A fast heartbeat  . A bad rash all over body  . Dizziness and weakness   Immunizations Administered    Name Date Dose VIS Date Route   Pfizer COVID-19 Vaccine 12/25/2019  9:26 AM 0.3 mL 09/26/2019 Intramuscular   Manufacturer: [place]   Lot: [id number]   [place]: [id number]

## 2020-01-19 ENCOUNTER — Ambulatory Visit: Payer: 59 | Attending: Internal Medicine

## 2020-01-19 DIAGNOSIS — Z23 Encounter for immunization: Secondary | ICD-10-CM

## 2020-01-19 NOTE — Progress Notes (Signed)
   Covid-19 Vaccination Clinic  Name:  Tanya Richards    MRN: [id number] DOB: 05-10-1986  01/19/2020  [name] was observed post Covid-19 immunization for 15 minutes without incident. She was provided with Vaccine Information Sheet and instruction to access the V-Safe system.   [name] was instructed to call 911 with any severe reactions post vaccine: Marland Kitchen Difficulty breathing  . Swelling of face and throat  . A fast heartbeat  . A bad rash all over body  . Dizziness and weakness   Immunizations Administered    Name Date Dose VIS Date Route   Pfizer COVID-19 Vaccine 01/19/2020  9:11 AM 0.3 mL 09/26/2019 Intramuscular   Manufacturer: [place]   Lot: [id number]   [place]: [id number]

## 2020-06-07 LAB — VITAMIN B12: Vitamin B-12: 420

## 2020-06-07 LAB — VITAMIN D 25 HYDROXY (VIT D DEFICIENCY, FRACTURES): Vit D, 25-Hydroxy: 33.3

## 2020-06-08 LAB — TSH: TSH: 3.52 (ref 0.41–5.90)

## 2020-08-05 ENCOUNTER — Ambulatory Visit: Payer: 59 | Admitting: Family Medicine

## 2020-09-28 ENCOUNTER — Other Ambulatory Visit: Payer: Self-pay

## 2020-09-28 ENCOUNTER — Encounter: Payer: Self-pay | Admitting: Physician Assistant

## 2020-09-28 ENCOUNTER — Ambulatory Visit (INDEPENDENT_AMBULATORY_CARE_PROVIDER_SITE_OTHER): Payer: 59 | Admitting: Physician Assistant

## 2020-09-28 ENCOUNTER — Other Ambulatory Visit (HOSPITAL_COMMUNITY)
Admission: RE | Admit: 2020-09-28 | Discharge: 2020-09-28 | Disposition: A | Discharge status: Home / Self Care | Payer: 59 | Source: Ambulatory Visit | Attending: Physician Assistant | Admitting: Physician Assistant

## 2020-09-28 VITALS — BP 124/72 | HR 64 | Temp 98.2°F | Ht 66.0 in | Wt 206.2 lb

## 2020-09-28 DIAGNOSIS — R300 Dysuria: Secondary | ICD-10-CM | POA: Insufficient documentation

## 2020-09-28 DIAGNOSIS — J324 Chronic pansinusitis: Secondary | ICD-10-CM | POA: Diagnosis not present

## 2020-09-28 DIAGNOSIS — F32A Depression, unspecified: Secondary | ICD-10-CM

## 2020-09-28 DIAGNOSIS — E039 Hypothyroidism, unspecified: Secondary | ICD-10-CM | POA: Diagnosis not present

## 2020-09-28 DIAGNOSIS — E282 Polycystic ovarian syndrome: Secondary | ICD-10-CM | POA: Insufficient documentation

## 2020-09-28 DIAGNOSIS — F329 Major depressive disorder, single episode, unspecified: Secondary | ICD-10-CM

## 2020-09-28 DIAGNOSIS — E559 Vitamin D deficiency, unspecified: Secondary | ICD-10-CM | POA: Insufficient documentation

## 2020-09-28 LAB — POCT URINE PREGNANCY: Preg Test, Ur: NEGATIVE

## 2020-09-28 LAB — POCT URINALYSIS DIPSTICK
Bilirubin, UA: NEGATIVE
Blood, UA: 2
Glucose, UA: NEGATIVE
Ketones, UA: NEGATIVE
Leukocytes, UA: NEGATIVE
Nitrite, UA: NEGATIVE
Protein, UA: NEGATIVE
Spec Grav, UA: 1.015 (ref 1.010–1.025)
Urobilinogen, UA: 0.2 E.U./dL
pH, UA: 6.5 (ref 5.0–8.0)

## 2020-09-28 MED ORDER — CEPHALEXIN 500 MG PO CAPS: 500.0000 mg | ORAL_CAPSULE | Freq: Two times a day (BID) | ORAL | 0 refills | Status: DC

## 2020-09-28 NOTE — Assessment & Plan Note (Signed)
Has been on metformin

## 2020-09-28 NOTE — Patient Instructions (Signed)
It was great to see you!  Start oral keflex antibiotic for your possible UTI.  I will be in touch with your swab results. If symptoms worsen in the meantime, let me know and we will likely obtain imaging of some sort (CT vs ultrasound.)  I will put in referral for ENT.  Follow-up in 3 months for a physical.  Take care,  Inda Coke PA-C

## 2020-09-28 NOTE — Progress Notes (Signed)
Tanya Richards is a 34 y.o. female is here to establish care.  I acted as a Education administrator for [employer], PA-C Anselmo Pickler, LPN   History of Present Illness:   Chief Complaint  Patient presents with  . Establish Care    HPI    Hypothyroidism Started on levothyroxine 50 mcg after her pregnancy. She currently takes an additional weekly 50 mcg. Blood work done 3 months ago and was normal. Was done with Dr. Nehemiah Settle at [facility].    Urinary symptoms Pt c/o low back pain and burning with urination. Left side > right. Started yesterday. She took Ibuprofen last night. Hx Kidney stone, ovarian cysts and UTI. Denies: fevers, chills, malaise, nausea.   Depression/Anxiety Taking Zoloft 25 mg daily. This is working well for her. Sees a therapist regularly. Zoloft is her first and only medication that she has been on.   Chronic sinusitis Has ongoing chronic sinus issues. Would like to see ENT. Has trialed flonase in the past. Denies: fever, chills, purulent drainage, cough.   Health Maintenance Due  Topic Date Due  . Hepatitis C Screening  Never done  . PAP SMEAR-Modifier  07/19/2018    Past Medical History:  Diagnosis Date  . Anxiety   . Dermoid cyst   . Hypothyroidism   . Immunization, BCG   . Seizures ([place])    as a child for 1 year     Social History   Tobacco Use  . Smoking status: Never Smoker  . Smokeless tobacco: Never Used  Vaping Use  . Vaping Use: Never used  Substance Use Topics  . Alcohol use: Yes    Alcohol/week: 1.0 standard drink    Types: 1 Glasses of wine per week    Comment: rarely  . Drug use: No    Past Surgical History:  Procedure Laterality Date  . CESAREAN SECTION Right 02/12/2018   Procedure: CESAREAN SECTION, ovarian cystectomy, possible RSO, possibo RO;  Surgeon: Tyson Dense, MD;  Location: [facility];  Service: Obstetrics;  Laterality: Right;  primary edc 02/16/18 allergy to augmentin and  benadryl Tracey RNFA  . WISDOM TOOTH EXTRACTION      Family History  Problem Relation Age of Onset  . Hypertension Father   . Hyperlipidemia Father   . Diabetes Paternal Grandmother   . Cancer Paternal Grandmother        pancreatic  . Hyperlipidemia Paternal Grandmother   . Diabetes Paternal Grandfather   . Depression Mother   . Depression Maternal Grandmother   . Cancer Maternal Grandmother        colon  . Hyperlipidemia Paternal Uncle     PMHx, SurgHx, SocialHx, FamHx, Medications, and Allergies were reviewed in the Visit Navigator and updated as appropriate.   Patient Active Problem List   Diagnosis Date Noted  . PCOS (polycystic ovarian syndrome) 09/28/2020  . Vitamin D deficiency 09/28/2020  . Dermoid cyst 10/07/2019  . Depressive disorder 06/10/2019  . Disorder of thyroid gland 06/09/2019    Social History   Tobacco Use  . Smoking status: Never Smoker  . Smokeless tobacco: Never Used  Vaping Use  . Vaping Use: Never used  Substance Use Topics  . Alcohol use: Yes    Alcohol/week: 1.0 standard drink    Types: 1 Glasses of wine per week    Comment: rarely  . Drug use: No    Current Medications and Allergies:    Current Outpatient Medications:  .  levothyroxine (SYNTHROID, LEVOTHROID)  50 MCG tablet, Take 50 mcg by mouth daily before breakfast., Disp: , Rfl:  .  cephALEXin (KEFLEX) 500 MG capsule, Take 1 capsule (500 mg total) by mouth 2 (two) times daily., Disp: 14 capsule, Rfl: 0 .  ergocalciferol (VITAMIN D2) 1.25 MG (50000 UT) capsule, ergocalciferol (vitamin D2) 1,250 mcg (50,000 unit) capsule  TAKE 1 CAPSULE BY MOUTH 1 TIME A WEEK, Disp: , Rfl:  .  levonorgestrel (MIRENA, 52 MG,) 20 MCG/24HR IUD, Mirena 20 mcg/24 hours (6 yrs) 52 mg intrauterine device  Take 1 device by intrauterine route., Disp: , Rfl:  .  Multiple Vitamin (MULTIVITAMIN ADULT PO), multivitamin, Disp: , Rfl:  .  sertraline (ZOLOFT) 25 MG tablet, Take 25 mg by mouth daily., Disp: , Rfl:     Allergies  Allergen Reactions  . Augmentin [Amoxicillin-Pot Clavulanate] Other (See Comments)    Pt states that she had "really bad nightmares" while using this.   . Benadryl [Diphenhydramine] Other (See Comments)    Pt states that she had "really bad nightmares" while using this.    Review of Systems   ROS  Negative unless otherwise specified per HPI.  Vitals:   Vitals:   09/28/20 1057  BP: 124/72  Pulse: 64  Temp: 98.2 F (36.8 C)  TempSrc: Temporal  SpO2: 98%  Weight: 206 lb 4 oz (93.6 kg)  Height: 5\' 6"  (1.676 m)     Body mass index is 33.29 kg/m.   Physical Exam:    Physical Exam Vitals and nursing note reviewed.  Constitutional:      General: She is not in acute distress.    Appearance: She is well-developed. She is not ill-appearing, toxic-appearing or sickly-appearing.  Cardiovascular:     Rate and Rhythm: Normal rate and regular rhythm.     Pulses: Normal pulses.     Heart sounds: Normal heart sounds, S1 normal and S2 normal.     Comments: No LE edema Pulmonary:     Effort: Pulmonary effort is normal.     Breath sounds: Normal breath sounds.  Abdominal:     General: Abdomen is flat. Bowel sounds are normal.     Palpations: Abdomen is soft.     Tenderness: There is no abdominal tenderness. There is no right CVA tenderness or left CVA tenderness.  Skin:    General: Skin is warm, dry and intact.  Neurological:     Mental Status: She is alert.     GCS: GCS eye subscore is 4. GCS verbal subscore is 5. GCS motor subscore is 6.  Psychiatric:        Mood and Affect: Mood and affect normal.        Speech: Speech normal.        Behavior: Behavior normal. Behavior is cooperative.       Results for orders placed or performed in visit on 09/28/20  POCT urinalysis dipstick  Result Value Ref Range   Color, UA yellow    Clarity, UA slight cloudy    Glucose, UA Negative Negative   Bilirubin, UA Negative    Ketones, UA Negative    Spec Grav, UA 1.015  1.010 - 1.025   Blood, UA 2    pH, UA 6.5 5.0 - 8.0   Protein, UA Negative Negative   Urobilinogen, UA 0.2 0.2 or 1.0 E.U./dL   Nitrite, UA Negative    Leukocytes, UA Negative Negative   Appearance     Odor        Assessment and  Plan:    Giorgia was seen today for establish care.  Diagnoses and all orders for this visit:  Hypothyroidism, unspecified type Overall stable per patient, labs last checked 3 months ago. Management per endocrinology.  Follow-up with them as needed, we can take over as needed.  Dysuria UA with blood. Urine cultulre sent. Also obtained cervicovaginal swab. If symptoms worsen or persist despite treatment, will obtain imaging ([place] vs CT.) Start keflex at this time.  -     POCT urinalysis dipstick -     Urine Culture; Future -     Urine Culture -     POCT urine pregnancy -     Cervicovaginal ancillary only( [place])  Depressive disorder Doing well per patient with Zoloft 25 mg daily. Follow-up with [place] or ob-gyn for further management.   Chronic pansinusitis Referral to ENT per patient request.  Other orders -     cephALEXin (KEFLEX) 500 MG capsule; Take 1 capsule (500 mg total) by mouth 2 (two) times daily.   CMA or LPN served as scribe during this visit. History, Physical, and Plan performed by medical provider. The above documentation has been reviewed and is accurate and complete.  Time spent with patient today was 45 minutes which consisted of chart review, discussing diagnosis, work up, treatment answering questions and documentation.   Inda Coke, PA-C [place], [place] 09/28/2020  Follow-up: No follow-ups on file.

## 2020-09-29 LAB — CERVICOVAGINAL ANCILLARY ONLY
Bacterial Vaginitis (gardnerella): NEGATIVE
Candida Glabrata: NEGATIVE
Candida Vaginitis: NEGATIVE
Comment: NEGATIVE
Comment: NEGATIVE
Comment: NEGATIVE

## 2020-09-29 LAB — URINE CULTURE
MICRO NUMBER:: 11315160
SPECIMEN QUALITY:: ADEQUATE

## 2020-09-30 ENCOUNTER — Other Ambulatory Visit: Payer: Self-pay | Admitting: Physician Assistant

## 2020-09-30 ENCOUNTER — Encounter: Payer: Self-pay | Admitting: Physician Assistant

## 2020-09-30 DIAGNOSIS — R300 Dysuria: Secondary | ICD-10-CM

## 2020-09-30 MED ORDER — TAMSULOSIN HCL 0.4 MG PO CAPS: 0.4000 mg | ORAL_CAPSULE | Freq: Every day | ORAL | 0 refills | Status: DC

## 2020-09-30 NOTE — Telephone Encounter (Signed)
Pt is still feeling unwell. She is having burning and lower back pain. She wants to check if there is anything she should be doing or taking. She leaves tomorrow afternoon to go out of town for a wedding.

## 2020-10-11 ENCOUNTER — Encounter: Payer: Self-pay | Admitting: Physician Assistant

## 2020-10-21 ENCOUNTER — Other Ambulatory Visit: Payer: Self-pay

## 2020-10-21 ENCOUNTER — Ambulatory Visit (INDEPENDENT_AMBULATORY_CARE_PROVIDER_SITE_OTHER): Payer: 59 | Admitting: Otolaryngology

## 2020-10-21 VITALS — Temp 97.2°F

## 2020-10-21 DIAGNOSIS — J310 Chronic rhinitis: Secondary | ICD-10-CM

## 2020-10-21 DIAGNOSIS — H6983 Other specified disorders of Eustachian tube, bilateral: Secondary | ICD-10-CM

## 2020-10-21 NOTE — Progress Notes (Signed)
HPI: Tanya Richards is a 35 y.o. female who presents is referred by her PCP for evaluation of chronic sinus issues.  She knows that she has allergies as her sinuses are generally worse in the spring and fall.  She states that she gets sinus infections once or twice a year.  She also complains of fullness in her ears especially in the morning when she wakes up.  She does not notice any hearing problems presently.  She also complains of some postnasal drainage. Of note her father had to have surgery because of sinonasal polyps.  She is concerned that she may also have polyps. She has used Flonase in the past that seemed to help initially but she stopped it because it did not seem to work as well. She has tried antihistamines but most of these make her sleepy..  Past Medical History:  Diagnosis Date  . Anxiety   . Dermoid cyst   . Hypothyroidism   . Immunization, BCG   . Seizures (HCC)    as a child for 1 year   Past Surgical History:  Procedure Laterality Date  . CESAREAN SECTION Right 02/12/2018   Procedure: CESAREAN SECTION, ovarian cystectomy, possible RSO, possibo RO;  Surgeon: Ranae Pila, MD;  Location: [facility];  Service: Obstetrics;  Laterality: Right;  primary edc 02/16/18 allergy to augmentin and benadryl Tracey RNFA  . WISDOM TOOTH EXTRACTION     Social History   Socioeconomic History  . Marital status: Married    Spouse name: Not on file  . Number of children: Not on file  . Years of education: Not on file  . Highest education level: Not on file  Occupational History  . Not on file  Tobacco Use  . Smoking status: Never Smoker  . Smokeless tobacco: Never Used  Vaping Use  . Vaping Use: Never used  Substance and Sexual Activity  . Alcohol use: Yes    Alcohol/week: 1.0 standard drink    Types: 1 Glasses of wine per week    Comment: rarely  . Drug use: No  . Sexual activity: Yes    Birth control/protection: I.U.D.  Other Topics Concern  . Not  on file  Social History Narrative   2.5 and 70 yo daughters   Clinical biochemist for [employer]   Social Determinants of Health   Financial Resource Strain: Not on [employer] Insecurity: Not on file  Transportation Needs: Not on file  Physical Activity: Not on file  Stress: Not on file  Social Connections: Not on file   Family History  Problem Relation Age of Onset  . Hypertension Father   . Hyperlipidemia Father   . Diabetes Paternal Grandmother   . Cancer Paternal Grandmother        pancreatic  . Hyperlipidemia Paternal Grandmother   . Diabetes Paternal Grandfather   . Depression Mother   . Depression Maternal Grandmother   . Cancer Maternal Grandmother        colon  . Hyperlipidemia Paternal Uncle    Allergies  Allergen Reactions  . Augmentin [Amoxicillin-Pot Clavulanate] Other (See Comments)    Pt states that she had "really bad nightmares" while using this.   . Benadryl [Diphenhydramine] Other (See Comments)    Pt states that she had "really bad nightmares" while using this.   Prior to Admission medications   Medication Sig Start Date End Date Taking? Authorizing Provider  cephALEXin (KEFLEX) 500 MG capsule Take 1 capsule (500 mg total) by  mouth 2 (two) times daily. 09/28/20   Jarold Motto, PA  ergocalciferol (VITAMIN D2) 1.25 MG (50000 UT) capsule ergocalciferol (vitamin D2) 1,250 mcg (50,000 unit) capsule  TAKE 1 CAPSULE BY MOUTH 1 TIME A WEEK    [provider]  levonorgestrel (MIRENA, 52 MG,) 20 MCG/24HR IUD Mirena 20 mcg/24 hours (6 yrs) 52 mg intrauterine device  Take 1 device by intrauterine route.    [provider]  levothyroxine (SYNTHROID, LEVOTHROID) 50 MCG tablet Take 50 mcg by mouth daily before breakfast.    [provider]  Multiple Vitamin (MULTIVITAMIN ADULT PO) multivitamin    [provider]  sertraline (ZOLOFT) 25 MG tablet Take 25 mg by mouth daily. 09/12/20   [provider]  tamsulosin  (FLOMAX) 0.4 MG CAPS capsule Take 1 capsule (0.4 mg total) by mouth daily. 09/30/20   Jarold Motto, PA     Positive ROS: Otherwise negative  All other systems have been reviewed and were otherwise negative with the exception of those mentioned in the HPI and as above.  Physical Exam: Constitutional: Alert, well-appearing, no acute distress Ears: External ears without lesions or tenderness. Ear canals are clear bilaterally with intact, clear TMs bilaterally with good mobility on pneumatic otoscopy.  Hearing screening with the tuning forks revealed symmetric hearing with AC > BC bilaterally.  Weber midline. Nasal: External nose without lesions. Septum with minimal deformity.. Clear nasal passages otherwise.  On nasal endoscopy both middle meatus regions were clear.  No evidence of polyps.  No evidence of mucopurulent discharge.  Minimal edema.  Nasopharynx was clear with patent and clear eustachian tubes bilaterally. Oral: Lips and gums without lesions. Tongue and palate mucosa without lesions. Posterior oropharynx clear. Neck: No palpable adenopathy or masses Respiratory: Breathing comfortably  Skin: No facial/neck lesions or rash noted.  Procedures  Assessment: Chronic rhinitis with history of allergic rhinitis. Presently no clinical evidence of active sinus infection or polyps. Eustachian tube dysfunction related to chronic rhinitis.  Plan: Would recommend regular use of nasal steroid spray and prescribed Nasacort to try 2 sprays each nostril at night.  Also discussed with her concerning using saline irrigations on a as needed basis. She will follow-up as needed. Discussed with her that the least sedating antihistamines are Claritin and Allegra. She might benefit by seeing an allergist.   Narda Bonds, MD   CC:

## 2020-10-28 ENCOUNTER — Other Ambulatory Visit: Payer: Self-pay | Admitting: Physician Assistant

## 2020-12-27 ENCOUNTER — Encounter: Payer: Self-pay | Admitting: Physician Assistant

## 2020-12-27 ENCOUNTER — Ambulatory Visit (INDEPENDENT_AMBULATORY_CARE_PROVIDER_SITE_OTHER): Payer: 59 | Admitting: Physician Assistant

## 2020-12-27 ENCOUNTER — Other Ambulatory Visit: Payer: Self-pay

## 2020-12-27 VITALS — BP 120/78 | HR 74 | Temp 98.0°F | Ht 66.0 in | Wt 213.0 lb

## 2020-12-27 DIAGNOSIS — H9201 Otalgia, right ear: Secondary | ICD-10-CM

## 2020-12-27 DIAGNOSIS — F329 Major depressive disorder, single episode, unspecified: Secondary | ICD-10-CM

## 2020-12-27 DIAGNOSIS — F419 Anxiety disorder, unspecified: Secondary | ICD-10-CM

## 2020-12-27 DIAGNOSIS — Z0001 Encounter for general adult medical examination with abnormal findings: Secondary | ICD-10-CM

## 2020-12-27 DIAGNOSIS — F32A Depression, unspecified: Secondary | ICD-10-CM

## 2020-12-27 DIAGNOSIS — E559 Vitamin D deficiency, unspecified: Secondary | ICD-10-CM

## 2020-12-27 DIAGNOSIS — E039 Hypothyroidism, unspecified: Secondary | ICD-10-CM

## 2020-12-27 DIAGNOSIS — Z136 Encounter for screening for cardiovascular disorders: Secondary | ICD-10-CM

## 2020-12-27 DIAGNOSIS — Z13220 Encounter for screening for lipoid disorders: Secondary | ICD-10-CM

## 2020-12-27 DIAGNOSIS — E669 Obesity, unspecified: Secondary | ICD-10-CM

## 2020-12-27 DIAGNOSIS — Z1159 Encounter for screening for other viral diseases: Secondary | ICD-10-CM

## 2020-12-27 LAB — CBC WITH DIFFERENTIAL/PLATELET
Basophils Absolute: 0 10*3/uL (ref 0.0–0.1)
Basophils Relative: 0.8 % (ref 0.0–3.0)
Eosinophils Absolute: 0.1 10*3/uL (ref 0.0–0.7)
Eosinophils Relative: 2.3 % (ref 0.0–5.0)
HCT: 39.3 % (ref 36.0–46.0)
Hemoglobin: 13.2 g/dL (ref 12.0–15.0)
Lymphocytes Relative: 26.2 % (ref 12.0–46.0)
Lymphs Abs: 1.6 10*3/uL (ref 0.7–4.0)
MCHC: 33.6 g/dL (ref 30.0–36.0)
MCV: 91.1 fl (ref 78.0–100.0)
Monocytes Absolute: 0.4 10*3/uL (ref 0.1–1.0)
Monocytes Relative: 7.1 % (ref 3.0–12.0)
Neutro Abs: 3.8 10*3/uL (ref 1.4–7.7)
Neutrophils Relative %: 63.6 % (ref 43.0–77.0)
Platelets: 250 10*3/uL (ref 150.0–400.0)
RBC: 4.31 Mil/uL (ref 3.87–5.11)
RDW: 12.8 % (ref 11.5–15.5)
WBC: 6 10*3/uL (ref 4.0–10.5)

## 2020-12-27 LAB — VITAMIN D 25 HYDROXY (VIT D DEFICIENCY, FRACTURES): VITD: 32.8 ng/mL (ref 30.00–100.00)

## 2020-12-27 LAB — LIPID PANEL
Cholesterol: 194 mg/dL (ref 0–200)
HDL: 56.2 mg/dL (ref >39.00)
LDL Cholesterol: 117 mg/dL — ABNORMAL HIGH (ref 0–99)
NonHDL: 137.4
Total CHOL/HDL Ratio: 3
Triglycerides: 102 mg/dL (ref 0.0–149.0)
VLDL: 20.4 mg/dL (ref 0.0–40.0)

## 2020-12-27 LAB — COMPREHENSIVE METABOLIC PANEL
ALT: 11 U/L (ref 0–35)
AST: 10 U/L (ref 0–37)
Albumin: 3.8 g/dL (ref 3.5–5.2)
Alkaline Phosphatase: 49 U/L (ref 39–117)
BUN: 13 mg/dL (ref 6–23)
CO2: 28 mEq/L (ref 19–32)
Calcium: 9.3 mg/dL (ref 8.4–10.5)
Chloride: 104 mEq/L (ref 96–112)
Creatinine, Ser: 0.54 mg/dL (ref 0.40–1.20)
GFR: 118.99 mL/min (ref >60.00)
Glucose, Bld: 78 mg/dL (ref 70–99)
Potassium: 4.4 mEq/L (ref 3.5–5.1)
Sodium: 137 mEq/L (ref 135–145)
Total Bilirubin: 0.6 mg/dL (ref 0.2–1.2)
Total Protein: 6.3 g/dL (ref 6.0–8.3)

## 2020-12-27 MED ORDER — LEVOTHYROXINE SODIUM 50 MCG PO TABS: 50.0000 ug | ORAL_TABLET | Freq: Every day | ORAL | 1 refills | Status: DC

## 2020-12-27 NOTE — Progress Notes (Deleted)
Tanya Richards is a 35 y.o. female is here for follow up.  I acted as a Education administrator for [employer], PA-C [employer], LPN   History of Present Illness:   No chief complaint on file.   HPI  Health Maintenance Due  Topic Date Due  . Hepatitis C Screening  Never done  . PAP SMEAR-Modifier  07/19/2018    Past Medical History:  Diagnosis Date  . Anxiety   . Dermoid cyst   . Hypothyroidism   . Immunization, BCG   . Seizures ([place])    as a child for 1 year     Social History   Tobacco Use  . Smoking status: Never Smoker  . Smokeless tobacco: Never Used  Vaping Use  . Vaping Use: Never used  Substance Use Topics  . Alcohol use: Yes    Alcohol/week: 1.0 standard drink    Types: 1 Glasses of wine per week    Comment: rarely  . Drug use: No    Past Surgical History:  Procedure Laterality Date  . CESAREAN SECTION Right 02/12/2018   Procedure: CESAREAN SECTION, ovarian cystectomy, possible RSO, possibo RO;  Surgeon: Tyson Dense, MD;  Location: [facility];  Service: Obstetrics;  Laterality: Right;  primary edc 02/16/18 allergy to augmentin and benadryl Tracey RNFA  . WISDOM TOOTH EXTRACTION      Family History  Problem Relation Age of Onset  . Hypertension Father   . Hyperlipidemia Father   . Diabetes Paternal Grandmother   . Cancer Paternal Grandmother        pancreatic  . Hyperlipidemia Paternal Grandmother   . Diabetes Paternal Grandfather   . Depression Mother   . Depression Maternal Grandmother   . Cancer Maternal Grandmother        colon  . Hyperlipidemia Paternal Uncle     PMHx, SurgHx, SocialHx, FamHx, Medications, and Allergies were reviewed in the Visit Navigator and updated as appropriate.   Patient Active Problem List   Diagnosis Date Noted  . PCOS (polycystic ovarian syndrome) 09/28/2020  . Vitamin D deficiency 09/28/2020  . Dermoid cyst 10/07/2019  . Depressive disorder 06/10/2019  . Disorder of thyroid gland  06/09/2019    Social History   Tobacco Use  . Smoking status: Never Smoker  . Smokeless tobacco: Never Used  Vaping Use  . Vaping Use: Never used  Substance Use Topics  . Alcohol use: Yes    Alcohol/week: 1.0 standard drink    Types: 1 Glasses of wine per week    Comment: rarely  . Drug use: No    Current Medications and Allergies:    Current Outpatient Medications:  .  cephALEXin (KEFLEX) 500 MG capsule, Take 1 capsule (500 mg total) by mouth 2 (two) times daily., Disp: 14 capsule, Rfl: 0 .  ergocalciferol (VITAMIN D2) 1.25 MG (50000 UT) capsule, ergocalciferol (vitamin D2) 1,250 mcg (50,000 unit) capsule  TAKE 1 CAPSULE BY MOUTH 1 TIME A WEEK, Disp: , Rfl:  .  levonorgestrel (MIRENA, 52 MG,) 20 MCG/24HR IUD, Mirena 20 mcg/24 hours (6 yrs) 52 mg intrauterine device  Take 1 device by intrauterine route., Disp: , Rfl:  .  levothyroxine (SYNTHROID, LEVOTHROID) 50 MCG tablet, Take 50 mcg by mouth daily before breakfast., Disp: , Rfl:  .  Multiple Vitamin (MULTIVITAMIN ADULT PO), multivitamin, Disp: , Rfl:  .  sertraline (ZOLOFT) 25 MG tablet, Take 25 mg by mouth daily., Disp: , Rfl:  .  tamsulosin (FLOMAX) 0.4 MG CAPS capsule,  Take 1 capsule (0.4 mg total) by mouth daily., Disp: 30 capsule, Rfl: 0  Allergies  Allergen Reactions  . Augmentin [Amoxicillin-Pot Clavulanate] Other (See Comments)    Pt states that she had "really bad nightmares" while using this.   . Benadryl [Diphenhydramine] Other (See Comments)    Pt states that she had "really bad nightmares" while using this.    Review of Systems   ROS  Vitals:  There were no vitals filed for this visit.   There is no height or weight on file to calculate BMI.   Physical Exam:    Physical Exam   Assessment and Plan:    There are no diagnoses linked to this encounter.    ***  Inda Coke, PA-C [place] 12/27/2020  Follow-up: No follow-ups on file.

## 2020-12-27 NOTE — Patient Instructions (Signed)
It was great to see you!  Apply bacitracin to your earlobe up to two times daily. Follow-up if any worsening symptoms.  Referral for therapist placed.  Refilled levothyroxine today.  Please go to the lab for blood work.   Our office will call you with your results unless you have chosen to receive results via MyChart.  If your blood work is normal we will follow-up each year for physicals and as scheduled for chronic medical problems.  If anything is abnormal we will treat accordingly and get you in for a follow-up.  Take care,  [facility] Maintenance, Female Adopting a healthy lifestyle and getting preventive care are important in promoting health and wellness. Ask your health care provider about:  The right schedule for you to have regular tests and exams.  Things you can do on your own to prevent diseases and keep yourself healthy. What should I know about diet, weight, and exercise? Eat a healthy diet  Eat a diet that includes plenty of vegetables, fruits, low-fat dairy products, and lean protein.  Do not eat a lot of foods that are high in solid fats, added sugars, or sodium.   Maintain a healthy weight Body mass index (BMI) is used to identify weight problems. It estimates body fat based on height and weight. Your health care provider can help determine your BMI and help you achieve or maintain a healthy weight. Get regular exercise Get regular exercise. This is one of the most important things you can do for your health. Most adults should:  Exercise for at least 150 minutes each week. The exercise should increase your heart rate and make you sweat (moderate-intensity exercise).  Do strengthening exercises at least twice a week. This is in addition to the moderate-intensity exercise.  Spend less time sitting. Even light physical activity can be beneficial. Watch cholesterol and blood lipids Have your blood tested for lipids and cholesterol at 36 years of  age, then have this test every 5 years. Have your cholesterol levels checked more often if:  Your lipid or cholesterol levels are high.  You are older than 36 years of age.  You are at high risk for heart disease. What should I know about cancer screening? Depending on your health history and family history, you may need to have cancer screening at various ages. This may include screening for:  Breast cancer.  Cervical cancer.  Colorectal cancer.  Skin cancer.  Lung cancer. What should I know about heart disease, diabetes, and high blood pressure? Blood pressure and heart disease  High blood pressure causes heart disease and increases the risk of stroke. This is more likely to develop in people who have high blood pressure readings, are of African descent, or are overweight.  Have your blood pressure checked: ? Every 3-5 years if you are 70-40 years of age. ? Every year if you are 72 years old or older. Diabetes Have regular diabetes screenings. This checks your fasting blood sugar level. Have the screening done:  Once every three years after age 72 if you are at a normal weight and have a low risk for diabetes.  More often and at a younger age if you are overweight or have a high risk for diabetes. What should I know about preventing infection? Hepatitis B If you have a higher risk for hepatitis B, you should be screened for this virus. Talk with your health care provider to find out if you are at risk for  hepatitis B infection. Hepatitis C Testing is recommended for:  Everyone born from [birth year] through 1966.  Anyone with known risk factors for hepatitis C. Sexually transmitted infections (STIs)  Get screened for STIs, including gonorrhea and chlamydia, if: ? You are sexually active and are younger than 36 years of age. ? You are older than 36 years of age and your health care provider tells you that you are at risk for this type of infection. ? Your sexual activity has  changed since you were last screened, and you are at increased risk for chlamydia or gonorrhea. Ask your health care provider if you are at risk.  Ask your health care provider about whether you are at high risk for HIV. Your health care provider may recommend a prescription medicine to help prevent HIV infection. If you choose to take medicine to prevent HIV, you should first get tested for HIV. You should then be tested every 3 months for as long as you are taking the medicine. Pregnancy  If you are about to stop having your period (premenopausal) and you may become pregnant, seek counseling before you get pregnant.  Take 400 to 800 micrograms (mcg) of folic acid every day if you become pregnant.  Ask for birth control (contraception) if you want to prevent pregnancy. Osteoporosis and menopause Osteoporosis is a disease in which the bones lose minerals and strength with aging. This can result in bone fractures. If you are 81 years old or older, or if you are at risk for osteoporosis and fractures, ask your health care provider if you should:  Be screened for bone loss.  Take a calcium or vitamin D supplement to lower your risk of fractures.  Be given hormone replacement therapy (HRT) to treat symptoms of menopause. Follow these instructions at home: Lifestyle  Do not use any products that contain nicotine or tobacco, such as cigarettes, e-cigarettes, and chewing tobacco. If you need help quitting, ask your health care provider.  Do not use street drugs.  Do not share needles.  Ask your health care provider for help if you need support or information about quitting drugs. Alcohol use  Do not drink alcohol if: ? Your health care provider tells you not to drink. ? You are pregnant, may be pregnant, or are planning to become pregnant.  If you drink alcohol: ? Limit how much you use to 0-1 drink a day. ? Limit intake if you are breastfeeding.  Be aware of how much alcohol is in  your drink. In the [place] one drink equals one 12 oz bottle of beer (355 mL), one 5 oz glass of wine (148 mL), or one 1 oz glass of hard liquor (44 mL). General instructions  Schedule regular health, dental, and eye exams.  Stay current with your vaccines.  Tell your health care provider if: ? You often feel depressed. ? You have ever been abused or do not feel safe at home. Summary  Adopting a healthy lifestyle and getting preventive care are important in promoting health and wellness.  Follow your health care provider's instructions about healthy diet, exercising, and getting tested or screened for diseases.  Follow your health care provider's instructions on monitoring your cholesterol and blood pressure. This information is not intended to replace advice given to you by your health care provider. Make sure you discuss any questions you have with your health care provider. Document Revised: 09/25/2018 Document Reviewed: 09/25/2018 Elsevier Patient Education  2021 [employer].

## 2020-12-27 NOTE — Progress Notes (Signed)
I acted as a Education administrator for [employer], PA-C Anselmo Pickler, LPN   Subjective:    Tanya Richards is a 35 y.o. female and is here for a comprehensive physical exam.  HPI  Health Maintenance Due  Topic Date Due  . Hepatitis C Screening  Never done  . PAP SMEAR-Modifier  07/19/2018    Acute Concerns: R earlobe irritation -- wore new earrings for the first time, had irritation with slight discharge to her earlobe after this. No fever, chills.  Chronic Issues: Anxiety and depression -- increased zoloft to 50 mg in January. Doing well with this. She is interested in talk therapy. Has done this in the past. Hypothyroidism -- currently taking levothyroxine 50 mcg daily, and then would take an additional levothyroxine 50 mcg weekly -- she stopped this because  12/22/20 -- TSH 2.82 Vit D deficiency -- takes MVI daily, was on 50,000 IU weekly; current MVI is 2000 IU.  Health Maintenance: Immunizations -- UTD Colonoscopy -- N/A Mammogram -- N/A PAP -- UTD, done 06/2020 normal per pt, will get report Bone Density -- N/A Diet -- eating well Sleep habits -- sleeping "a lot" Exercise -- regularly; hoping to increase as weather permits Current Weight -- Weight: 213 lb (96.6 kg)  Weight History: Wt Readings from Last 10 Encounters:  12/27/20 213 lb (96.6 kg)  09/28/20 206 lb 4 oz (93.6 kg)  12/22/18 196 lb (88.9 kg)  12/18/18 190 lb 12.8 oz (86.5 kg)  02/12/18 217 lb (98.4 kg)  01/30/18 210 lb (95.3 kg)  12/08/17 206 lb 4 oz (93.6 kg)  12/04/17 206 lb 12.8 oz (93.8 kg)  11/02/17 197 lb 9.6 oz (89.6 kg)  06/06/14 197 lb 12.8 oz (89.7 kg)   Body mass index is 34.38 kg/m. Mood -- improved Patient's last menstrual period was 12/10/2020.   Depression screen [facility] 2/9 12/27/2020  Decreased Interest 0  Down, Depressed, Hopeless 0  PHQ - 2 Score 0  Altered sleeping 0  Tired, decreased energy 0  Change in appetite 0  Feeling bad or failure about yourself  0  Trouble  concentrating 0  Moving slowly or fidgety/restless 0  Suicidal thoughts 0  PHQ-9 Score 0  Difficult doing work/chores Not difficult at all     Other providers/specialists: Patient Care Team: Inda Coke, [place] as PCP - General (Physician Assistant)   PMHx, SurgHx, SocialHx, Medications, and Allergies were reviewed in the Visit Navigator and updated as appropriate.   Past Medical History:  Diagnosis Date  . Anxiety   . Dermoid cyst   . Hypothyroidism   . Immunization, BCG   . Seizures ([place])    as a child for 1 year     Past Surgical History:  Procedure Laterality Date  . CESAREAN SECTION Right 02/12/2018   Procedure: CESAREAN SECTION, ovarian cystectomy, possible RSO, possibo RO;  Surgeon: Tyson Dense, MD;  Location: [facility];  Service: Obstetrics;  Laterality: Right;  primary edc 02/16/18 allergy to augmentin and benadryl Tracey RNFA  . WISDOM TOOTH EXTRACTION       Family History  Problem Relation Age of Onset  . Hypertension Father   . Hyperlipidemia Father   . Diabetes Paternal Grandmother   . Cancer Paternal Grandmother        pancreatic  . Hyperlipidemia Paternal Grandmother   . Diabetes Paternal Grandfather   . Depression Mother   . Osteopenia Mother   . Depression Maternal Grandmother   . Cancer Maternal Grandmother 33  colon  . Osteoporosis Maternal Grandmother   . Hyperlipidemia Paternal Uncle     Social History   Tobacco Use  . Smoking status: Never Smoker  . Smokeless tobacco: Never Used  Vaping Use  . Vaping Use: Never used  Substance Use Topics  . Alcohol use: Yes    Alcohol/week: 1.0 standard drink    Types: 1 Glasses of wine per week    Comment: rarely  . Drug use: No    Review of Systems:   Review of Systems  Constitutional: Negative for chills, fever, malaise/fatigue and weight loss.  HENT: Negative for hearing loss, sinus pain and sore throat.   Eyes: Negative for blurred vision.  Respiratory:  Negative for cough and shortness of breath.   Cardiovascular: Negative for chest pain, palpitations and leg swelling.  Gastrointestinal: Negative for abdominal pain, constipation, diarrhea, heartburn, nausea and vomiting.  Genitourinary: Negative for dysuria, frequency and urgency.  Musculoskeletal: Negative for back pain, myalgias and neck pain.  Skin: Negative for itching and rash.  Neurological: Negative for dizziness, tingling, seizures, loss of consciousness and headaches.  Endo/Heme/Allergies: Negative for polydipsia.  Psychiatric/Behavioral: Negative for depression. The patient is not nervous/anxious.   All other systems reviewed and are negative.   Objective:   BP 120/78 (BP Location: Left Arm, Patient Position: Sitting, Cuff Size: Large)   Pulse 74   Temp 98 F (36.7 C) (Temporal)   Ht 5\' 6"  (1.676 m)   Wt 213 lb (96.6 kg)   LMP 12/10/2020   SpO2 98%   BMI 34.38 kg/m   General Appearance:    Alert, cooperative, no distress, appears stated age  Head:    Normocephalic, without obvious abnormality, atraumatic  Eyes:    PERRL, conjunctiva/corneas clear, EOM's intact, fundi    benign, both eyes  Ears:    Normal TM's and external ear canals, both ears  Nose:   Nares normal, septum midline, mucosa normal, no drainage    or sinus tenderness  Throat:   Lips, mucosa, and tongue normal; teeth and gums normal  Neck:   Supple, symmetrical, trachea midline, no adenopathy;    thyroid:  no enlargement/tenderness/nodules; no carotid   bruit or JVD  Back:     Symmetric, no curvature, ROM normal, no CVA tenderness  Lungs:     Clear to auscultation bilaterally, respirations unlabored  Chest Wall:    No tenderness or deformity   Heart:    Regular rate and rhythm, S1 and S2 normal, no murmur, rub   or gallop  Breast Exam:    Deferred  Abdomen:     Soft, non-tender, bowel sounds active all four quadrants,    no masses, no organomegaly  Genitalia:    Deferred  Rectal:    Deferred   Extremities:   Extremities normal, atraumatic, no cyanosis or edema  Pulses:   2+ and symmetric all extremities  Skin:   Skin color, texture, turgor normal, no rashes  R ear lobe with crusting, erythema and mild swelling  Lymph nodes:   Cervical, supraclavicular, and axillary nodes normal  Neurologic:   CNII-XII intact, normal strength, sensation and reflexes    throughout    Assessment/Plan:   Cyriah was seen today for annual exam.  Diagnoses and all orders for this visit:  Encounter for general adult medical examination with abnormal findings Today patient counseled on age appropriate routine health concerns for screening and prevention, each reviewed and up to date or declined. Immunizations reviewed and up to date  or declined. Labs ordered and reviewed. Risk factors for depression reviewed and negative. Hearing function and visual acuity are intact. ADLs screened and addressed as needed. Functional ability and level of safety reviewed and appropriate. Education, counseling and referrals performed based on assessed risks today. Patient provided with a copy of personalized plan for preventive services.  Depressive disorder; Anxiety Stable on Zoloft 50 mg daily. Referral for talk therapy per patient request. -     CBC with Differential/Platelet -     Comprehensive metabolic panel -     Ambulatory referral to Psychology  Vitamin D deficiency Update vitamin D level and provide recommendations as able. -     VITAMIN D 25 Hydroxy (Vit-D Deficiency, Fractures)  Encounter for screening for other viral diseases -     Hepatitis C antibody  Encounter for lipid screening for cardiovascular disease -     Lipid panel  Hypothyroidism Euthyroid based on previous blood work from a few days ago. Refill levothyroxine 50 mcg daily. Follow-up in 6 months, sooner is needed.  Pain of right earlobe Suspect contact dermatitis. Recommend starting topical bacitracin to area given recent purulent  discharge. Follow-up as needed.  Obesity Encouraged healthy diet and exercise.  Other orders -     levothyroxine (SYNTHROID) 50 MCG tablet; Take 1 tablet (50 mcg total) by mouth daily before breakfast.   Well Adult Exam: Labs ordered: Yes. Patient counseling was done. See below for items discussed. Discussed the patient's BMI. The BMI is not in the acceptable range; BMI management plan is completed Follow up in one year.  Patient Counseling:   [x]     Nutrition: Stressed importance of moderation in sodium/caffeine intake, saturated fat and cholesterol, caloric balance, sufficient intake of fresh fruits, vegetables, fiber, calcium, iron, and 1 mg of folate supplement per day (for females capable of pregnancy).   [x]      Stressed the importance of regular exercise.    [x]     Substance Abuse: Discussed cessation/primary prevention of tobacco, alcohol, or other drug use; driving or other dangerous activities under the influence; availability of treatment for abuse.    [x]      Injury prevention: Discussed safety belts, safety helmets, smoke detector, smoking near bedding or upholstery.    [x]      Sexuality: Discussed sexually transmitted diseases, partner selection, use of condoms, avoidance of unintended pregnancy  and contraceptive alternatives.    [x]     Dental health: Discussed importance of regular tooth brushing, flossing, and dental visits.   [x]      Health maintenance and immunizations reviewed. Please refer to Health maintenance section.   CMA or LPN served as scribe during this visit. History, Physical, and Plan performed by medical provider. The above documentation has been reviewed and is accurate and complete.  Inda Coke, PA-C [place]

## 2020-12-28 LAB — HEPATITIS C ANTIBODY
Hepatitis C Ab: NONREACTIVE
SIGNAL TO CUT-OFF: 0 (ref <1.00)

## 2021-10-24 ENCOUNTER — Ambulatory Visit: Payer: 59 | Admitting: Internal Medicine

## 2021-11-29 ENCOUNTER — Ambulatory Visit (INDEPENDENT_AMBULATORY_CARE_PROVIDER_SITE_OTHER): Payer: 59 | Admitting: Dermatology

## 2021-11-29 ENCOUNTER — Other Ambulatory Visit: Payer: Self-pay

## 2021-11-29 ENCOUNTER — Encounter: Payer: Self-pay | Admitting: Dermatology

## 2021-11-29 DIAGNOSIS — L918 Other hypertrophic disorders of the skin: Secondary | ICD-10-CM

## 2021-11-29 DIAGNOSIS — I781 Nevus, non-neoplastic: Secondary | ICD-10-CM

## 2021-11-29 DIAGNOSIS — Z1283 Encounter for screening for malignant neoplasm of skin: Secondary | ICD-10-CM

## 2021-11-29 DIAGNOSIS — L858 Other specified epidermal thickening: Secondary | ICD-10-CM

## 2021-11-29 DIAGNOSIS — D1801 Hemangioma of skin and subcutaneous tissue: Secondary | ICD-10-CM | POA: Diagnosis not present

## 2021-12-16 ENCOUNTER — Encounter: Payer: Self-pay | Admitting: Dermatology

## 2021-12-16 NOTE — Progress Notes (Signed)
° °  New Patient   Subjective  Tanya Richards is a 36 y.o. female who presents for the following: New Patient (Initial Visit) (2 Lesions on bikini line x years - itches and bleeds. Lesion on neck x 5 years- itches. Lesion on back x years. ).  General skin examination, several spots of concern Location:  Duration:  Quality:  Associated Signs/Symptoms: Modifying Factors:  Severity:  Timing: Context:    The following portions of the chart were reviewed this encounter and updated as appropriate:  Tobacco   Allergies   Meds   Problems   Med Hx   Surg Hx   Fam Hx       Objective  Well appearing patient in no apparent distress; mood and affect are within normal limits. Scalp General skin examination: No atypical pigmented lesions or nonmelanoma skin cancer.  Left Axilla, Right Thigh - Anterior Pedunculated 2 to 3 mm flesh-colored papules  Right Upper Eyelid 1 mm smooth red dermal papule  Left Upper Arm - Posterior, Right Upper Arm - Posterior Follicular keratotic micropapules  Right Nasal Sidewall Flat telangiectasia with no dermoscopic changes of BCC.    A full examination was performed including scalp, head, eyes, ears, nose, lips, neck, chest, axillae, abdomen, back, buttocks, bilateral upper extremities, bilateral lower extremities, hands, feet, fingers, toes, fingernails, and toenails. All findings within normal limits unless otherwise noted below.  Areas beneath undergarments not fully examined.   Assessment & Plan  Encounter for screening for malignant neoplasm of skin Scalp  Annual skin examination, encouraged to self examine with spouse twice annually.  Continue ultraviolet protection.  Skin tag (2) Right Thigh - Anterior; Left Axilla  Pedunculated 2 to 3 mm flesh-colored papules  Hemangioma of skin Right Upper Eyelid  No intervention necessary  Keratosis pilaris (2) Left Upper Arm - Posterior; Right Upper Arm - Posterior  No intervention  initiated  Telangiectasia Right Nasal Sidewall  If there is bleeding or development of growth return for biopsy

## 2021-12-19 ENCOUNTER — Ambulatory Visit: Payer: 59 | Admitting: Family Medicine

## 2021-12-19 ENCOUNTER — Ambulatory Visit (INDEPENDENT_AMBULATORY_CARE_PROVIDER_SITE_OTHER): Payer: 59 | Admitting: Family Medicine

## 2021-12-19 ENCOUNTER — Encounter: Payer: Self-pay | Admitting: Family Medicine

## 2021-12-19 VITALS — BP 140/80 | HR 90 | Temp 98.3°F | Ht 66.0 in | Wt 220.0 lb

## 2021-12-19 DIAGNOSIS — J40 Bronchitis, not specified as acute or chronic: Secondary | ICD-10-CM | POA: Diagnosis not present

## 2021-12-19 LAB — POCT URINE PREGNANCY: Preg Test, Ur: NEGATIVE

## 2021-12-19 MED ORDER — ALBUTEROL SULFATE HFA 108 (90 BASE) MCG/ACT IN AERS: 2.0000 | INHALATION_SPRAY | Freq: Four times a day (QID) | RESPIRATORY_TRACT | 1 refills | Status: DC | PRN

## 2021-12-19 MED ORDER — PREDNISONE 20 MG PO TABS: ORAL_TABLET | ORAL | 0 refills | Status: AC

## 2021-12-19 MED ORDER — AZITHROMYCIN 250 MG PO TABS: ORAL_TABLET | ORAL | 0 refills | Status: AC

## 2021-12-19 NOTE — Progress Notes (Signed)
? ?Subjective:  ? ? ? Patient ID: Tanya Richards, female    DOB: 07-02-1986, 36 y.o.   MRN: [id number] ? ?Chief Complaint  ?Patient presents with  ? Cough  ?  Started 3 weeks ago ?Productive cough, feels like chest is congested ?Taking zyrtec and Flonase ?Unable to sleep at night  ? ? ?HPI ?Chief complaint: cough/congestion ?Symptom onset: 3 wks ?Pertinent positives: seen on 3/4.  Getting worse.  More sob now. Productive cough/chest congested, coughing a lot.  Can't sleep d/t cough.  Had covid in Dec and cleared. Was hoarse last week, but ok now ?Pertinent negatives: covid neg test 3/3.  No h/o asthma ?Treatments tried: zyrtec/flonase   using condoms  LMP 11/23/21 ?Vaccine status: first 2 and booster covid.  ?Sick exposure: none  ? ?Health Maintenance Due  ?Topic Date Due  ? PAP SMEAR-Modifier  07/19/2018  ? ? ?Past Medical History:  ?Diagnosis Date  ? Anxiety   ? Dermoid cyst   ? Hypothyroidism   ? Immunization, BCG   ? Seizures ([place])   ? as a child for 1 year  ? ? ?Past Surgical History:  ?Procedure Laterality Date  ? CESAREAN SECTION Right 02/12/2018  ? Procedure: CESAREAN SECTION, ovarian cystectomy, possible RSO, possibo RO;  Surgeon: Tyson Dense, MD;  Location: [facility];  Service: Obstetrics;  Laterality: Right;  primary ?edc 02/16/18 ?allergy to augmentin and benadryl ?[place]  ? WISDOM TOOTH EXTRACTION    ? ? ?Outpatient Medications Prior to Visit  ?Medication Sig Dispense Refill  ? cetirizine (ZYRTEC) 10 MG tablet Take by mouth.    ? Multiple Vitamins-Minerals (MULTIVITAMINS PLUS ZINC PO)     ? Triamcinolone Acetonide (NASACORT ALLERGY 24HR NA) Place 2 sprays into the nose daily in the afternoon.    ? levonorgestrel (MIRENA, 52 MG,) 20 MCG/DAY IUD Mirena 21 mcg/24 hours (8 yrs) 52 mg intrauterine device ? Take 1 device by intrauterine route.    ? benzonatate (TESSALON) 100 MG capsule Take by mouth. (Patient not taking: Reported on 12/19/2021)    ? Multiple Vitamin (MULTIVITAMIN ADULT PO)  multivitamin    ? ?No facility-administered medications prior to visit.  ? ? ?Allergies  ?Allergen Reactions  ? Amoxicillin-Pot Clavulanate Other (See Comments) and Rash  ?  Pt states that she had "really bad nightmares" while using this.  ?Other reaction(s): Unknown ?Pt states that she had "really bad nightmares" while using this.  ?Other reaction(s): Other, Other (See Comments), Unknown, Unknown ?Pt states that she had "really bad nightmares" while using this.  ?Pt states that she had "really bad nightmares" while using this.  ?  ? Diphenhydramine Other (See Comments) and Rash  ?  Pt states that she had "really bad nightmares" while using this. ?Other reaction(s): Unknown ?Pt states that she had "really bad nightmares" while using this. ?Other reaction(s): Other, Other (See Comments), Unknown, Unknown ?Pt states that she had "really bad nightmares" while using this. ?Pt states that she had "really bad nightmares" while using this. ?  ? ?ROS neg/noncontributory except as noted HPI/below ? ? ?   ?Objective:  ?  ? ?BP 140/80   Pulse 90   Temp 98.3 ?F (36.8 ?C) (Temporal)   Ht '5\' 6"'$  (1.676 m)   Wt 220 lb (99.8 kg)   SpO2 98%   BMI 35.51 kg/m?  ?Wt Readings from Last 3 Encounters:  ?12/19/21 220 lb (99.8 kg)  ?12/27/20 213 lb (96.6 kg)  ?09/28/20 206 lb 4 oz (93.6 kg)  ? ? ?   ? ?  Gen: WDWN NAD OWF ?HEENT: NCAT, conjunctiva not injected, sclera nonicteric ?TM WNL B, OP moist, no exudates  ?NECK:  supple, no thyromegaly, no nodes, no carotid bruits ?CARDIAC: RRR, S1S2+, no murmur.  ?LUNGS: CTAB. End exp wheezes-good ae.   ?EXT:  no edema ?MSK: no gross abnormalities.  ?NEURO: A&O x3.  CN II-XII intact.  ?PSYCH: normal mood. Good eye contact ? ?Results for orders placed or performed in visit on 12/19/21  ?POCT urine pregnancy  ?Result Value Ref Range  ? Preg Test, Ur Negative Negative  ?  ? ?Assessment & Plan:  ? ?Problem List Items Addressed This Visit   ?None ?Visit Diagnoses   ? ? Bronchitis    -  Primary  ? ?  ?  Bronchitis-u preg to make sure pred ok.  As going on 3 wks, zpk   albuterol for wheezing, prednisone.  Worse sob-ER.  Consider cxr if not improving. ? ?No orders of the defined types were placed in this encounter. ? ? ?Wellington Hampshire, MD ? ? ?

## 2021-12-19 NOTE — Patient Instructions (Signed)
Meds have been sent the the pharmacy °You can take tylenol for pain/fevers °If worsening symptoms, let us know or go to the Emergency room  ° ° °

## 2021-12-19 NOTE — Progress Notes (Signed)
cough

## 2022-07-10 ENCOUNTER — Encounter: Payer: Self-pay | Admitting: *Deleted

## 2022-08-10 ENCOUNTER — Other Ambulatory Visit: Payer: Self-pay | Admitting: Home Modifications

## 2022-08-10 DIAGNOSIS — E063 Autoimmune thyroiditis: Secondary | ICD-10-CM

## 2022-08-16 ENCOUNTER — Ambulatory Visit
Admission: RE | Admit: 2022-08-16 | Discharge: 2022-08-16 | Disposition: A | Discharge status: Home / Self Care | Payer: 59 | Source: Ambulatory Visit | Attending: Home Modifications | Admitting: Home Modifications

## 2022-08-16 DIAGNOSIS — E063 Autoimmune thyroiditis: Secondary | ICD-10-CM

## 2022-11-29 ENCOUNTER — Ambulatory Visit: Payer: 59 | Admitting: Dermatology

## 2023-10-30 ENCOUNTER — Other Ambulatory Visit: Payer: Self-pay | Admitting: Obstetrics and Gynecology

## 2023-10-30 DIAGNOSIS — R928 Other abnormal and inconclusive findings on diagnostic imaging of breast: Secondary | ICD-10-CM

## 2023-11-06 ENCOUNTER — Ambulatory Visit
Admission: RE | Admit: 2023-11-06 | Discharge: 2023-11-06 | Disposition: A | Discharge status: Home / Self Care | Payer: 59 | Source: Ambulatory Visit | Attending: Obstetrics and Gynecology | Admitting: Obstetrics and Gynecology

## 2023-11-06 DIAGNOSIS — R928 Other abnormal and inconclusive findings on diagnostic imaging of breast: Secondary | ICD-10-CM

## 2023-11-08 ENCOUNTER — Other Ambulatory Visit: Payer: Self-pay | Admitting: Obstetrics and Gynecology

## 2023-11-08 DIAGNOSIS — N6320 Unspecified lump in the left breast, unspecified quadrant: Secondary | ICD-10-CM

## 2024-01-20 ENCOUNTER — Encounter (HOSPITAL_COMMUNITY): Payer: Self-pay

## 2024-01-20 ENCOUNTER — Other Ambulatory Visit: Payer: Self-pay

## 2024-01-20 ENCOUNTER — Emergency Department (HOSPITAL_COMMUNITY)

## 2024-01-20 ENCOUNTER — Emergency Department (HOSPITAL_COMMUNITY)
Admission: EM | Admit: 2024-01-20 | Discharge: 2024-01-20 | Disposition: A | Discharge status: Home / Self Care | Attending: Emergency Medicine | Admitting: Emergency Medicine

## 2024-01-20 DIAGNOSIS — R300 Dysuria: Secondary | ICD-10-CM | POA: Diagnosis present

## 2024-01-20 DIAGNOSIS — N83201 Unspecified ovarian cyst, right side: Secondary | ICD-10-CM | POA: Diagnosis not present

## 2024-01-20 DIAGNOSIS — R1031 Right lower quadrant pain: Secondary | ICD-10-CM | POA: Diagnosis not present

## 2024-01-20 HISTORY — DX: Hypothyroidism, unspecified: E03.9

## 2024-01-20 LAB — CBC WITH DIFFERENTIAL/PLATELET
Abs Immature Granulocytes: 0.01 10*3/uL (ref 0.00–0.07)
Basophils Absolute: 0.1 10*3/uL (ref 0.0–0.1)
Basophils Relative: 1 %
Eosinophils Absolute: 0.1 10*3/uL (ref 0.0–0.5)
Eosinophils Relative: 1 %
HCT: 41.9 % (ref 36.0–46.0)
Hemoglobin: 13.3 g/dL (ref 12.0–15.0)
Immature Granulocytes: 0 %
Lymphocytes Relative: 35 %
Lymphs Abs: 2.1 10*3/uL (ref 0.7–4.0)
MCH: 30.1 pg (ref 26.0–34.0)
MCHC: 31.7 g/dL (ref 30.0–36.0)
MCV: 94.8 fL (ref 80.0–100.0)
Monocytes Absolute: 0.4 10*3/uL (ref 0.1–1.0)
Monocytes Relative: 6 %
Neutro Abs: 3.4 10*3/uL (ref 1.7–7.7)
Neutrophils Relative %: 57 %
Platelets: 260 10*3/uL (ref 150–400)
RBC: 4.42 MIL/uL (ref 3.87–5.11)
RDW: 12.4 % (ref 11.5–15.5)
WBC: 6 10*3/uL (ref 4.0–10.5)
nRBC: 0 % (ref 0.0–0.2)

## 2024-01-20 LAB — WET PREP, GENITAL
Clue Cells Wet Prep HPF POC: NONE SEEN
Sperm: NONE SEEN
Trich, Wet Prep: NONE SEEN
WBC, Wet Prep HPF POC: >=10 — AB (ref <10)
Yeast Wet Prep HPF POC: NONE SEEN

## 2024-01-20 LAB — URINALYSIS, ROUTINE W REFLEX MICROSCOPIC
Bilirubin Urine: NEGATIVE
Glucose, UA: NEGATIVE mg/dL
Ketones, ur: NEGATIVE mg/dL
Leukocytes,Ua: NEGATIVE
Nitrite: NEGATIVE
Protein, ur: NEGATIVE mg/dL
Specific Gravity, Urine: 1.003 — ABNORMAL LOW (ref 1.005–1.030)
pH: 8 (ref 5.0–8.0)

## 2024-01-20 LAB — COMPREHENSIVE METABOLIC PANEL WITH GFR
ALT: 15 U/L (ref 0–44)
AST: 17 U/L (ref 15–41)
Albumin: 3.6 g/dL (ref 3.5–5.0)
Alkaline Phosphatase: 46 U/L (ref 38–126)
Anion gap: 7 (ref 5–15)
BUN: 14 mg/dL (ref 6–20)
CO2: 23 mmol/L (ref 22–32)
Calcium: 8.9 mg/dL (ref 8.9–10.3)
Chloride: 107 mmol/L (ref 98–111)
Creatinine, Ser: 0.47 mg/dL (ref 0.44–1.00)
GFR, Estimated: >60 mL/min (ref >60)
Glucose, Bld: 93 mg/dL (ref 70–99)
Potassium: 4 mmol/L (ref 3.5–5.1)
Sodium: 137 mmol/L (ref 135–145)
Total Bilirubin: 0.8 mg/dL (ref 0.0–1.2)
Total Protein: 6.9 g/dL (ref 6.5–8.1)

## 2024-01-20 LAB — HCG, SERUM, QUALITATIVE: Preg, Serum: NEGATIVE

## 2024-01-20 LAB — LIPASE, BLOOD: Lipase: 28 U/L (ref 11–51)

## 2024-01-20 MED ORDER — IOHEXOL 300 MG/ML  SOLN
100.0000 mL | Freq: Once | INTRAMUSCULAR | Status: AC | PRN
  Administered 2024-01-20: 100 mL via INTRAVENOUS

## 2024-01-20 MED ORDER — KETOROLAC TROMETHAMINE 15 MG/ML IJ SOLN
15.0000 mg | Freq: Once | INTRAMUSCULAR | Status: AC
  Administered 2024-01-20: 15 mg via INTRAVENOUS
  Filled 2024-01-20: qty 1

## 2024-01-20 NOTE — ED Provider Notes (Signed)
 Patient's care assumed by me at 3:30 PM.  Patient is pending ultrasound transvaginal with Doppler.  Patient presented to the emergency department with lower abdominal pain.  Patient has had a CT scan which shows an ovarian cyst. Ultrasound shows right ovarian cyst measuring 1.8 cm.  Patient is counseled on results.  Patient is advised to follow-up with her OB/GYN for recheck return to the emergency department if any problems.   Tanya Richards 01/20/24 1724    Rondel Baton, MD 01/24/24 [phone]

## 2024-01-20 NOTE — ED Provider Notes (Signed)
 [place] EMERGENCY DEPARTMENT AT [facility] Provider Note   CSN: [id number] Arrival date & time: 01/20/24  1042     History Chief Complaint  Patient presents with   Abdominal Pain    Tanya Richards is a 38 y.o. female with history of Hashimoto's presents emerged from today for evaluation abdominal pain that started yesterday.  The times it is cramping in nature.  Reports it was in her lower belly, mainly on the right and radiates to the back.  She is also describing of some internal "burning".  She reports she has having some urgency and frequency.  The burning happen without urination.  She denies any hematuria.  Denies any concern for any STDs.  She denies any melena, hematochezia, vaginal discharge, or vaginal bleeding.  Denies any nausea or vomiting.  Is able to pass gas.  She reports the past 2 days she has felt more constipated and is only had small bowel movements.  She reports that "she does not feel empty".  Surgical history of C-section.  Denies any tobacco, EtOH, or illicit drug use   Abdominal Pain Associated symptoms: constipation and dysuria   Associated symptoms: no chest pain, no chills, no diarrhea, no fever, no hematuria, no nausea, no shortness of breath, no vaginal bleeding, no vaginal discharge and no vomiting        Home Medications Prior to Admission medications   Medication Sig Start Date End Date Taking? Authorizing Provider  albuterol (VENTOLIN HFA) 108 (90 Base) MCG/ACT inhaler Inhale 2 puffs into the lungs every 6 (six) hours as needed for wheezing or shortness of breath. 12/19/21   Jeani Sow, MD  cetirizine (ZYRTEC) 10 MG tablet Take by mouth.    [provider]  Multiple Vitamins-Minerals (MULTIVITAMINS PLUS ZINC PO)     [provider]  Triamcinolone Acetonide (NASACORT ALLERGY 24HR NA) Place 2 sprays into the nose daily in the afternoon.    [provider]      Allergies    Amoxicillin-pot clavulanate  and Diphenhydramine    Review of Systems   Review of Systems  Constitutional:  Negative for chills and fever.  Respiratory:  Negative for shortness of breath.   Cardiovascular:  Negative for chest pain.  Gastrointestinal:  Positive for abdominal pain and constipation. Negative for blood in stool, diarrhea, nausea and vomiting.  Genitourinary:  Positive for dysuria, frequency and urgency. Negative for hematuria, vaginal bleeding and vaginal discharge.    Physical Exam Updated Vital Signs BP 121/85   Pulse (!) 51   Temp 97.8 F (36.6 C) (Oral)   Resp 18   Ht 5\' 6"  (1.676 m)   Wt 97.1 kg   SpO2 98%   BMI 34.54 kg/m  Physical Exam Vitals and nursing note reviewed. Exam conducted with a chaperone present Sheria Lang, RN).  Constitutional:      General: She is not in acute distress.    Appearance: She is not ill-appearing or toxic-appearing.  Eyes:     General: No scleral icterus. Cardiovascular:     Rate and Rhythm: Normal rate.  Pulmonary:     Effort: Pulmonary effort is normal. No respiratory distress.  Abdominal:     General: There is no distension.     Palpations: Abdomen is soft.     Tenderness: There is abdominal tenderness in the right lower quadrant. There is no guarding or rebound.  Genitourinary:    Vagina: Normal. No erythema, tenderness or bleeding.     Cervix: No  cervical motion tenderness or friability.     Comments: Small amount of white discharge present. Unremarkable pelvic exam. Skin:    General: Skin is warm and dry.  Neurological:     Mental Status: She is alert.     ED Results / Procedures / Treatments   Labs (all labs ordered are listed, but only abnormal results are displayed) Labs Reviewed  URINALYSIS, ROUTINE W REFLEX MICROSCOPIC - Abnormal; Notable for the following components:      Result Value   Color, Urine COLORLESS (*)    Specific Gravity, Urine 1.003 (*)    Hgb urine dipstick SMALL (*)    Bacteria, UA RARE (*)    All other components  within normal limits  WET PREP, GENITAL  URINE CULTURE  HCG, SERUM, QUALITATIVE  CBC WITH DIFFERENTIAL/PLATELET  COMPREHENSIVE METABOLIC PANEL WITH GFR  LIPASE, BLOOD    EKG None  Radiology CT ABDOMEN PELVIS W CONTRAST Result Date: 01/20/2024 CLINICAL DATA:  Right lower quadrant abdominal pain. Right ovarian cyst. EXAM: CT ABDOMEN AND PELVIS WITH CONTRAST TECHNIQUE: Multidetector CT imaging of the abdomen and pelvis was performed using the standard protocol following bolus administration of intravenous contrast. RADIATION DOSE REDUCTION: This exam was performed according to the departmental dose-optimization program which includes automated exposure control, adjustment of the mA and/or kV according to patient size and/or use of iterative reconstruction technique. CONTRAST:  OMNIPAQUE IOHEXOL 300 MG/ML  SOLN COMPARISON:  04/21/2015. FINDINGS: Lower chest: No acute abnormality. Hepatobiliary: No focal liver abnormality is seen. No gallstones, gallbladder wall thickening, or biliary dilatation. Pancreas: Unremarkable. No pancreatic ductal dilatation or surrounding inflammatory changes. Spleen: Normal in size without focal abnormality. Adrenals/Urinary Tract: The adrenal glands are within normal limits. The kidneys enhance symmetrically. No renal calculus or obstructive uropathy is seen bilaterally. The bladder is unremarkable. Stomach/Bowel: Stomach is within normal limits. Appendix appears normal. No evidence of bowel wall thickening, distention, or inflammatory changes. No free air or pneumatosis is seen. Vascular/Lymphatic: No significant vascular findings are present. No enlarged abdominal or pelvic lymph nodes. Reproductive: The uterus is within normal limits. There is a cyst in the right ovary measuring 1.9 cm, likely dominant follicle. No additional imaging is recommended. No adnexal mass on the left. Other: No abdominopelvic ascites. A fat containing umbilical hernia is present.  Musculoskeletal: No acute osseous abnormality. IMPRESSION: 1. No acute intra-abdominal process. 2. Right ovarian cyst measuring 1.9 cm, likely dominant follicle. 3. Fat containing umbilical hernia. Electronically Signed   By: Thornell Sartorius M.D.   On: 01/20/2024 14:48    Procedures Procedures   Medications Ordered in ED Medications  ketorolac (TORADOL) 15 MG/ML injection 15 mg (15 mg Intravenous Given 01/20/24 1321)  iohexol (OMNIPAQUE) 300 MG/ML solution 100 mL (100 mLs Intravenous Contrast Given 01/20/24 1426)    ED Course/ Medical Decision Making/ A&P                                Medical Decision Making Amount and/or Complexity of Data Reviewed Labs: ordered. Radiology: ordered.  Risk Prescription drug management.   38 y.o. female presents to the ER for evaluation of lower abdominal pain. Differential diagnosis includes but is not limited to AAA, mesenteric ischemia, appendicitis, diverticulitis, DKA, gastroenteritis, nephrolithiasis, pancreatitis, constipation, UTI, bowel obstruction, biliary disease, IBD, PUD, hepatitis, ectopic pregnancy, ovarian torsion, PID. Vital signs mild bradycardia otherwise unremarkable. Physical exam as noted above.   I independently reviewed and interpreted  the patient's labs.  CBC without leukocytosis or anemia.  Pregnancy test is negative.  CMP shows no electrolyte or LFT abnormality.  Lipase within normal limits.  Urinalysis shows colorless, dilute urine with small amount of hemoglobin present.  Rare bacteria but no red blood cells or white blood cells.  Wet prep pending.  CT Abd shows 1. No acute intra-abdominal process. 2. Right ovarian cyst measuring 1.9 cm, likely dominant follicle. 3. Fat containing umbilical hernia. Per radiologist's interpretation.    [place] ordered and is pending.   4:20 PM Care of Kem Parcher  transferred to PA Langston Masker at the end of my shift as the patient will require reassessment once labs/imaging have resulted.  Patient presentation, ED course, and plan of care discussed with review of all pertinent labs and imaging. Please see his/her note for further details regarding further ED course and disposition. Plan at time of handoff is follow up on [place] and wet prep. This may be altered or completely changed at the discretion of the oncoming team pending results of further workup.  Portions of this report may have been transcribed using voice recognition software. Every effort was made to ensure accuracy; however, inadvertent computerized transcription errors may be present.   Final Clinical Impression(s) / ED Diagnoses Final diagnoses:  None    Rx / DC Orders ED Discharge Orders     None         Achille Rich, PA-C 01/20/24 1624    Pricilla Loveless, MD 01/22/24 [phone]

## 2024-01-20 NOTE — Discharge Instructions (Addendum)
Ibuprofen for discomfort.  Return if any problems.

## 2024-01-20 NOTE — ED Triage Notes (Signed)
 Patient began having right lower pelvic/abdominal pain since last night. Has a cyst pea size on her right ovary. Urgent care sent her for further testing. Burning urination and a burning sensation in her bladder area at rest.

## 2024-01-21 LAB — URINE CULTURE: Culture: NO GROWTH

## 2024-05-12 ENCOUNTER — Other Ambulatory Visit: Payer: Self-pay | Admitting: Obstetrics and Gynecology

## 2024-05-12 ENCOUNTER — Ambulatory Visit
Admission: RE | Admit: 2024-05-12 | Discharge: 2024-05-12 | Disposition: A | Discharge status: Home / Self Care | Source: Ambulatory Visit | Attending: Obstetrics and Gynecology | Admitting: Obstetrics and Gynecology

## 2024-05-12 DIAGNOSIS — N6320 Unspecified lump in the left breast, unspecified quadrant: Secondary | ICD-10-CM

## 2024-06-09 ENCOUNTER — Other Ambulatory Visit: Payer: Self-pay | Admitting: Obstetrics and Gynecology

## 2024-06-09 DIAGNOSIS — N6320 Unspecified lump in the left breast, unspecified quadrant: Secondary | ICD-10-CM

## 2024-08-18 ENCOUNTER — Ambulatory Visit: Attending: Internal Medicine | Admitting: Internal Medicine

## 2024-08-18 ENCOUNTER — Encounter: Payer: Self-pay | Admitting: Internal Medicine

## 2024-08-18 VITALS — BP 134/80 | HR 68 | Ht 66.0 in | Wt 221.0 lb

## 2024-08-18 DIAGNOSIS — I10 Essential (primary) hypertension: Secondary | ICD-10-CM | POA: Diagnosis not present

## 2024-08-18 DIAGNOSIS — I517 Cardiomegaly: Secondary | ICD-10-CM | POA: Diagnosis not present

## 2024-08-18 DIAGNOSIS — E782 Mixed hyperlipidemia: Secondary | ICD-10-CM | POA: Insufficient documentation

## 2024-08-18 NOTE — Patient Instructions (Signed)
 Medication Instructions:  Your physician recommends that you continue on your current medications as directed. Please refer to the Current Medication list given to you today.  *If you need a refill on your cardiac medications before your next appointment, please call your pharmacy*  Follow-Up: At [facility], you and your health needs are our priority.  As part of our continuing mission to provide you with exceptional heart care, our providers are all part of one team.  This team includes your primary Cardiologist (physician) and Advanced Practice Providers or APPs (Physician Assistants and Nurse Practitioners) who all work together to provide you with the care you need, when you need it.  Your next appointment:   As needed with Dr. Santo

## 2024-08-18 NOTE — Progress Notes (Signed)
 Cardiology Office Note:  .    Date:  08/18/2024  ID:  Tanya Richards, DOB 1986/01/31, MRN [id number] PCP: [facility]  [facility] Providers Cardiologist:  Stanly DELENA Leavens, MD     CC: HCM Consulted for the evaluation of LVH at the behest of Dr. Dwight   History of Present Illness: .    Tanya Richards is a 38 y.o. female  with concentric hypertrophy who presents for evaluation of hypertrophic cardiomyopathy. She was referred by [facility] for evaluation of hypertrophic cardiomyopathy.  She has a history of concentric hypertrophy and has recently developed new T wave inversions along with a slight decrease in exercise tolerance. An echocardiogram from an outside institution revealed mild concentric hypertrophy with a maximal septal thickness of 12 mm and posterior thickness of 12 mm, and an LV mass index of 102 grams per meter squared.  She has a history of morbid obesity and hyperlipidemia, with an LDL of 132. Her hypertension typically presents with blood pressures ranging from 130s to 140s, but was recently recorded as high as 160/90s during a visit to her endocrinologist.  She has a history of Hashimoto's thyroiditis and has been on thyroid  medication. She was on Armour Thyroid  for about a year but experienced difficulty in finding the right dose. Her last dose was 90 mg, which led to a transition from a hypothyroid to a hyperthyroid state within five weeks, causing her blood pressure to rise significantly. She was recently switched back to levothyroxine  after being off medication for four days, and she started it again yesterday.  Her family history is significant for various heart conditions. Her father recently had open heart surgery for mitral valve repair and had a history of atrial fibrillation, which was difficult to control, leading to a Cox Maze procedure. He also had a Watchman device implanted, was on blood thinners, and had a stroke.  Her father's brother had coronary artery disease, and her grandfather had a defibrillator and died of a heart attack while driving.  She feels anxious and attributes many of her symptoms to her thyroid  condition. She has been experiencing inflammation and difficulty losing weight despite eating clean, being gluten-free for two and a half years, and exercising regularly. She is concerned about the impact of inflammation and weight on her heart health.  Discussed the use of AI scribe software for clinical note transcription with the patient, who gave verbal consent to proceed.   Relevant histories: .  Social  - Living Situation: Lives with husband and children. - Patient is a paramedic. She has a strong family history of heart disease, including her father and uncles. She is concerned about her health due to her family history and her own health conditions, including Hashimoto's disease and fluctuating thyroid  levels. She is proactive about her health and is planning a family trip to [place] (she is half [place])  ROS: As per HPI.   Studies Reviewed: SABRA    LABS LDL: 132  RADIOLOGY Echocardiogram: Mild concentric hypertrophy with maximal septal thickness of 12 mm, posterior wall thickness of 12 mm, LV mass index of 102 g/m  Physical Exam:    VS:  BP 134/80   Pulse 68   Ht 5' 6 (1.676 m)   Wt 221 lb (100.2 kg)   BMI 35.67 kg/m    Wt Readings from Last 3 Encounters:  08/18/24 221 lb (100.2 kg)  01/20/24 214 lb (97.1 kg)  12/19/21 220 lb (99.8 kg)  Gen: no distress   Neck: No JVD Cardiac: No Rubs or Gallops, systolic flow  Murmur, RRR +2 radial pulses Respiratory: Clear to auscultation bilaterally, normal effort, normal  respiratory rate GI: Soft, nontender, non-distended  MS: No  edema;  moves all extremities Integument: Skin feels warm Neuro:  At time of evaluation, alert and oriented to person/place/time/situation  Psych: Normal affect, patient feels ok     ASSESSMENT AND  PLAN: .    LVH Evaluation for hypertrophic cardiomyopathy Mild concentric hypertrophy with a maximal septal thickness of 12 mm and posterior thickness of 12 mm, with a LV mass index of 102 g/m2. No mitral valve regurgitation or obstructive physiology noted. EKG findings are normal. Family history of heart conditions, but no known hypertrophic cardiomyopathy. Differential diagnosis includes hypertrophic cardiomyopathy versus other causes of concentric hypertrophy. - Ordered cardiac MRI to assess heart function, valve leaks, and tissue inflammation. - If MRI is normal, will remove hypertrophic cardiomyopathy from diagnosis list.  Hashimoto's thyroiditis with hypothyroidism Hashimoto's thyroiditis with recent fluctuations in thyroid  function tests. Recent switch from Armour Thyroid  to levothyroxine  due to hyperthyroid symptoms. Thyroid  dysregulation may contribute to elevated blood pressure and cholesterol levels. - Continue levothyroxine  therapy.  Hyperlipidemia LDL of 132 mg/dL. Family history of coronary artery disease. Current management focuses on optimizing thyroid  function to stabilize cholesterol levels. Discussed potential for lipoprotein(a) testing to assess genetic risk for aggressive cholesterol. - Will consider lipoprotein(a) testing to assess genetic risk for aggressive cholesterol (deferred after SDM presently) - Will reassess cholesterol management after thyroid  function is optimized.  Hypertension Blood pressures ranging from 130s to 140s. Recent fluctuations in blood pressure likely related to thyroid  dysregulation. No consistent history of hypertension prior to recent thyroid  issues. - Monitor blood pressure regularly; if BP elevated when euthyroid, start Coreg 6.25 mg PO BID  Morbid obesity Concerns about inflammation and weight impacting heart health. She maintains a healthy lifestyle with regular exercise and a gluten-free diet. - Continue current healthy lifestyle  practices. - Monitor weight and inflammation markers. - this may improve with thyroid  didsease  PRN f/u   Stanly Leavens, MD FASE [facility] Cardiologist [facility]  [address] [place], [address] [place], [place] [address] [phone]  11:07 AM

## 2024-11-13 ENCOUNTER — Ambulatory Visit
Admission: RE | Admit: 2024-11-13 | Discharge: 2024-11-13 | Disposition: A | Discharge status: Home / Self Care | Source: Ambulatory Visit | Attending: Obstetrics and Gynecology | Admitting: Obstetrics and Gynecology

## 2024-11-13 ENCOUNTER — Other Ambulatory Visit: Payer: Self-pay | Admitting: Obstetrics and Gynecology

## 2024-11-13 DIAGNOSIS — N6320 Unspecified lump in the left breast, unspecified quadrant: Secondary | ICD-10-CM

## 2024-11-14 ENCOUNTER — Ambulatory Visit
Admission: RE | Admit: 2024-11-14 | Discharge: 2024-11-14 | Disposition: A | Source: Ambulatory Visit | Attending: Obstetrics and Gynecology | Admitting: Obstetrics and Gynecology

## 2024-11-14 DIAGNOSIS — N6320 Unspecified lump in the left breast, unspecified quadrant: Secondary | ICD-10-CM

## 2024-11-17 LAB — SURGICAL PATHOLOGY
